# Patient Record
Sex: Female | Born: 1945 | Race: White | Hispanic: No | Smoking: Former smoker
Health system: Southern US, Community
[De-identification: ages and names within clinical notes are randomized; demographics above are authoritative.]

## PROBLEM LIST (undated history)

## (undated) ENCOUNTER — Ambulatory Visit

## (undated) DIAGNOSIS — R9431 Abnormal electrocardiogram [ECG] [EKG]: Secondary | ICD-10-CM

## (undated) DIAGNOSIS — M8580 Other specified disorders of bone density and structure, unspecified site: Secondary | ICD-10-CM

## (undated) DIAGNOSIS — R0683 Snoring: Secondary | ICD-10-CM

## (undated) DIAGNOSIS — D649 Anemia, unspecified: Secondary | ICD-10-CM

## (undated) DIAGNOSIS — F329 Major depressive disorder, single episode, unspecified: Secondary | ICD-10-CM

## (undated) DIAGNOSIS — E785 Hyperlipidemia, unspecified: Secondary | ICD-10-CM

## (undated) DIAGNOSIS — F419 Anxiety disorder, unspecified: Secondary | ICD-10-CM

## (undated) DIAGNOSIS — E039 Hypothyroidism, unspecified: Secondary | ICD-10-CM

## (undated) DIAGNOSIS — G4700 Insomnia, unspecified: Principal | ICD-10-CM

## (undated) DIAGNOSIS — R011 Cardiac murmur, unspecified: Secondary | ICD-10-CM

## (undated) DIAGNOSIS — F32A Depression, unspecified: Secondary | ICD-10-CM

## (undated) DIAGNOSIS — M1990 Unspecified osteoarthritis, unspecified site: Secondary | ICD-10-CM

## (undated) HISTORY — DX: Snoring: R06.83

## (undated) HISTORY — DX: Other specified disorders of bone density and structure, unspecified site: M85.80

## (undated) HISTORY — DX: Abnormal electrocardiogram (ECG) (EKG): R94.31

## (undated) HISTORY — PX: TONSILLECTOMY: SUR1361

## (undated) HISTORY — DX: Insomnia, unspecified: G47.00

## (undated) HISTORY — DX: Anxiety disorder, unspecified: F41.9

## (undated) HISTORY — DX: Major depressive disorder, single episode, unspecified: F32.9

## (undated) HISTORY — DX: Depression, unspecified: F32.A

## (undated) HISTORY — DX: Anemia, unspecified: D64.9

## (undated) HISTORY — DX: Hyperlipidemia, unspecified: E78.5

---

## 1990-11-06 HISTORY — PX: OTHER SURGICAL HISTORY: SHX169

## 1997-12-09 ENCOUNTER — Other Ambulatory Visit: Admission: RE | Admit: 1997-12-09 | Discharge: 1997-12-09 | Payer: Self-pay | Admitting: *Deleted

## 1998-05-24 ENCOUNTER — Other Ambulatory Visit: Admission: RE | Admit: 1998-05-24 | Discharge: 1998-05-24 | Payer: Self-pay | Admitting: Dermatology

## 1998-06-29 ENCOUNTER — Other Ambulatory Visit: Admission: RE | Admit: 1998-06-29 | Discharge: 1998-06-29 | Payer: Self-pay | Admitting: *Deleted

## 1999-07-05 ENCOUNTER — Other Ambulatory Visit: Admission: RE | Admit: 1999-07-05 | Discharge: 1999-07-05 | Payer: Self-pay | Admitting: *Deleted

## 2000-03-06 HISTORY — PX: OTHER SURGICAL HISTORY: SHX169

## 2000-03-14 ENCOUNTER — Ambulatory Visit (HOSPITAL_BASED_OUTPATIENT_CLINIC_OR_DEPARTMENT_OTHER): Admission: RE | Admit: 2000-03-14 | Discharge: 2000-03-14 | Payer: Self-pay | Admitting: Orthopedic Surgery

## 2000-09-06 ENCOUNTER — Other Ambulatory Visit: Admission: RE | Admit: 2000-09-06 | Discharge: 2000-09-06 | Payer: Self-pay | Admitting: *Deleted

## 2000-12-19 ENCOUNTER — Emergency Department (HOSPITAL_COMMUNITY): Admission: EM | Admit: 2000-12-19 | Discharge: 2000-12-19 | Payer: Self-pay | Admitting: Emergency Medicine

## 2001-01-16 ENCOUNTER — Ambulatory Visit (HOSPITAL_COMMUNITY): Admission: RE | Admit: 2001-01-16 | Discharge: 2001-01-16 | Payer: Self-pay | Admitting: Orthopedic Surgery

## 2001-01-16 ENCOUNTER — Encounter: Payer: Self-pay | Admitting: Orthopedic Surgery

## 2001-01-18 ENCOUNTER — Other Ambulatory Visit: Admission: RE | Admit: 2001-01-18 | Discharge: 2001-01-18 | Payer: Self-pay | Admitting: *Deleted

## 2001-08-13 ENCOUNTER — Ambulatory Visit (HOSPITAL_COMMUNITY): Admission: RE | Admit: 2001-08-13 | Discharge: 2001-08-13 | Payer: Self-pay | Admitting: Gastroenterology

## 2001-09-04 ENCOUNTER — Ambulatory Visit (HOSPITAL_BASED_OUTPATIENT_CLINIC_OR_DEPARTMENT_OTHER): Admission: RE | Admit: 2001-09-04 | Discharge: 2001-09-04 | Payer: Self-pay | Admitting: Orthopedic Surgery

## 2001-09-18 ENCOUNTER — Other Ambulatory Visit: Admission: RE | Admit: 2001-09-18 | Discharge: 2001-09-18 | Payer: Self-pay | Admitting: *Deleted

## 2002-02-05 ENCOUNTER — Other Ambulatory Visit: Admission: RE | Admit: 2002-02-05 | Discharge: 2002-02-05 | Payer: Self-pay | Admitting: *Deleted

## 2002-03-05 ENCOUNTER — Ambulatory Visit (HOSPITAL_COMMUNITY): Admission: RE | Admit: 2002-03-05 | Discharge: 2002-03-05 | Payer: Self-pay | Admitting: Internal Medicine

## 2006-08-27 ENCOUNTER — Other Ambulatory Visit: Admission: RE | Admit: 2006-08-27 | Discharge: 2006-08-27 | Payer: Self-pay | Admitting: *Deleted

## 2007-10-07 HISTORY — PX: COLONOSCOPY: SHX174

## 2012-08-07 ENCOUNTER — Other Ambulatory Visit (HOSPITAL_COMMUNITY): Payer: Self-pay | Admitting: Internal Medicine

## 2012-08-07 DIAGNOSIS — J45909 Unspecified asthma, uncomplicated: Secondary | ICD-10-CM

## 2012-08-29 ENCOUNTER — Ambulatory Visit (HOSPITAL_COMMUNITY)
Admission: RE | Admit: 2012-08-29 | Discharge: 2012-08-29 | Disposition: A | Discharge status: Home / Self Care | Payer: Medicare Other | Source: Ambulatory Visit | Attending: Internal Medicine | Admitting: Internal Medicine

## 2012-08-29 DIAGNOSIS — J45909 Unspecified asthma, uncomplicated: Secondary | ICD-10-CM | POA: Insufficient documentation

## 2012-08-29 MED ORDER — ALBUTEROL SULFATE (5 MG/ML) 0.5% IN NEBU
2.5000 mg | INHALATION_SOLUTION | Freq: Once | RESPIRATORY_TRACT | Status: AC
  Administered 2012-08-29: 2.5 mg via RESPIRATORY_TRACT

## 2014-04-27 ENCOUNTER — Ambulatory Visit (INDEPENDENT_AMBULATORY_CARE_PROVIDER_SITE_OTHER): Payer: Medicare Other | Admitting: Neurology

## 2014-04-27 ENCOUNTER — Encounter: Payer: Self-pay | Admitting: Neurology

## 2014-04-27 VITALS — BP 108/72 | HR 64 | Resp 18 | Ht 64.75 in | Wt 136.0 lb

## 2014-04-27 DIAGNOSIS — G4700 Insomnia, unspecified: Secondary | ICD-10-CM

## 2014-04-27 DIAGNOSIS — R0683 Snoring: Secondary | ICD-10-CM

## 2014-04-27 DIAGNOSIS — F32A Depression, unspecified: Secondary | ICD-10-CM

## 2014-04-27 DIAGNOSIS — R0989 Other specified symptoms and signs involving the circulatory and respiratory systems: Secondary | ICD-10-CM

## 2014-04-27 DIAGNOSIS — F329 Major depressive disorder, single episode, unspecified: Secondary | ICD-10-CM

## 2014-04-27 DIAGNOSIS — R0609 Other forms of dyspnea: Secondary | ICD-10-CM

## 2014-04-27 DIAGNOSIS — F3289 Other specified depressive episodes: Secondary | ICD-10-CM

## 2014-04-27 HISTORY — DX: Snoring: R06.83

## 2014-04-27 HISTORY — DX: Insomnia, unspecified: G47.00

## 2014-04-27 NOTE — Progress Notes (Signed)
[facility] SLE[facility]   [name]:  [name], M D  Referring Symone Cornman: Jarome Matin, MD Primary Care Physician:  Garlan Fillers, MD  Chief Complaint  Patient presents with  . New Evaluation    Room 10  . Sleep consult    HPI:  Kaylee Hull is a 68 y.o. caucasian, right handed, divorced female, who is seen here as a referral from Dr. Eloise Harman for an insomnia work up.  Reports that she has frequent trouble to initiate sleep feeling very fatigued and desires to go to sleep. She also stated that the symptoms are reminiscent of previous period of insomnia that occurred while she suffered from anxiety and depression. The suspicion is that the current insomnia is also related to a similar underlying problem with depression. She had several periods of monopolar depression over the last 2 30 years .  She describes a sense of Doom, a fear of something bad is going to happen to her, and she wakes up frequently throughout the night . She wakes up early , and she sleeps not longer than 2 hours "en bloc" .  She goes to the bathroom because she is awake, not because the urinary urgency woke her. Her feelings of helplessness and loss of control are independent of her knowledge of the difference- she is healthy and has no specific financial or other worries. . She was until 12 month ago on Zoloft, but not any longer. Hot flushes started after she d/c the Zoloft. She was already "done with monopause" more than 10 years ago. She feels frustrated about the poor sleep quality, in spite of dietary efforts, exercises and medical attention.  The insomnia started 4 month ago again, and she has been told she snores, but  never has been told that she has apnea. She lives alone, her grandchildren reported the snoring. She feels that snoring has woken herself rarely, never had she choked or struggled for breathing.  She rarely takes Ambien, about ' twice a year". 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  She states she never had asthma , but carried the diagnosis in old medical charts.   Sleep habits : the patient goes to bed around 11.30 to midnight , some nights going right to sleep, other nights being awake for an hour. She doesn't need an alarm , wakes spontaneously around 7AM.  Unrefreshed, struggling to get up,  but feels her depression lifting a little after she gets up . Drinks no longer coffee in AM, only water . Her appetite is unchanged. Not easily nauseated. She sometimes naps , she fell asleep in bible study recently. Her naps have been reduced form 1 hour to about 20 minutes  She never feels asleep- there could be a sleep perception, as she doubts she slept at all but hears from her grandchildren a report that she slept.      Review of Systems: Out of a complete 14 system review, the patient complains of only the following symptoms, and all other reviewed systems are negative. Snoring, anxiety, a worries.   History   Social History  . Marital Status: Divorced    Spouse Name: N/A    Number of Children: 2  . Years of Education: College   Occupational History  . Not on file.   Social History Main Topics  .  Smoking status: Former Smoker    Quit date: 05/13/1991  . Smokeless tobacco: Never Used  . Alcohol Use: Yes     Comment: rarely  . Drug Use: No  . Sexual Activity: Not on file   Other Topics Concern  . Not on file   Social History Narrative   Patient is divorced and lives alone.   Patient has two adult children.   Patient is retired.   Patient has a Chiropodistcollege eduction.   Patient is right-handed.   Patient does not drink any caffeine.    Family History  Problem Relation Age of Onset  . Diabetes Mother   . Liver cancer Father   . Coronary artery disease Mother   . Lung cancer Mother     Past  Medical History  Diagnosis Date  . Fibromyalgia   . Migraine headache   . Hyperlipidemia   . Osteopenia   . Depression   . Anxiety   . Anemia   . Abnormal EKG   . Insomnia 04/27/2014    Past Surgical History  Procedure Laterality Date  . Lap pelvis/d & c  1990's  . Laser throat  1992  . 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 Uterine emd/d & c, hysteroscopy  03/02  . Colonoscopy  12/08    several polyps removed    Current Outpatient Prescriptions  Medication Sig Dispense Refill  . alendronate (FOSAMAX) 70 MG tablet 1 tablet once a week.      . levothyroxine (SYNTHROID, LEVOTHROID) 50 MCG tablet 1 tablet daily.      . polyethylene glycol powder (GLYCOLAX/MIRALAX) powder 1 Capful in water or juice, stir and drink as needed      . simvastatin (ZOCOR) 40 MG tablet 1 tablet daily.      Marland Kitchen. tretinoin (RETIN-A) 0.05 % cream As needed      . Vitamin D, Ergocalciferol, (DRISDOL) 50000 UNITS CAPS capsule Take 50,000 Units by mouth every 7 (seven) days.      Marland Kitchen. zolpidem (AMBIEN) 10 MG tablet 10 mg. Taking 1/2- 1 tablet by mouth at bedtime as needed for sleep       No current facility-administered medications for this visit.    Allergies as of 04/27/2014 - Review Complete 04/27/2014  Allergen Reaction Noted  . Other  04/27/2014  . Sulfa antibiotics  04/27/2014    Vitals: BP 108/72  Pulse 64  Resp 18  Ht 5' 4.75" (1.645 m)  Wt 136 lb (61.689 kg)  BMI 22.80 kg/m2 Last Weight:  Wt Readings from Last 1 Encounters:  04/27/14 136 lb (61.689 kg)   Last Height:   Ht Readings from Last 1 Encounters:  04/27/14 5' 4.75" (1.645 m)    Physical exam:  General: The patient is awake, alert and appears not in acute distress. The patient is well groomed. Head: Normocephalic, atraumatic. Neck is supple. Mallampati 3 - low uvular - no retrognathia, , neck circumference: 14 inches, no TMJ but click over the left jaw. .  Cardiovascular:  Regular rate and rhythm , without  murmurs or carotid bruit, and  without distended neck veins. No retrognathia, normal dentition. Respiratory: Lungs are clear to auscultation. Skin:  Without evidence of edema, or rash Trunk: BMI is elevated and patient  has normal posture.  Neurologic exam : The patient is awake and alert, oriented to place and time.  Memory subjective  described as intact.  There is a normal attention span & concentration ability. Speech is fluent without dysarthria, dysphonia or  aphasia. Mood and affect are soft spoken and concerned . Cranial nerves: Pupils are equal and briskly reactive to light. Funduscopic exam without  evidence of pallor or edema.  Extraocular movements  in vertical and horizontal planes intact and without nystagmus. Visual fields by finger perimetry are intact. Hearing to finger rub intact.  Facial sensation intact to fine touch. Facial motor strength is symmetric and tongue and uvula move midline.  Motor exam:   Normal tone and normal muscle bulk and symmetric normal strength in all extremities.  Sensory:  Fine touch, pinprick and vibration were tested in all extremities. Proprioception is  normal.  Coordination: Rapid alternating movements in the fingers/hands is tested and normal. Finger-to-nose maneuver tested and normal without evidence of ataxia, dysmetria or tremor.  Gait and station: Patient walks without assistive device . Deep tendon reflexes: in the  upper and lower extremities are symmetric and intact. Babinski maneuver response is  downgoing.   Assessment:  After physical and neurologic examination, review of laboratory studies, imaging, neurophysiology testing and pre-existing records, assessment is   1) this insomnia resembles previous phases the patient experienced at various times in her life.  There is most likely depression and anxiety causing the early morning awakening , sleep sustaining insomnia.   Plan:  Treatment plan and additional workup : 1) I will be working her up for snoring and  possible apnea as organic causes contributing to Insomnia.  If these are negative ,she will ned to follow up with psychologist or psychiatrist to address the depression.

## 2014-04-27 NOTE — Patient Instructions (Signed)
Home sleep test. Insomnia Insomnia is frequent trouble falling and/or staying asleep. Insomnia can be a long term problem or a short term problem. Both are common. Insomnia can be a short term problem when the wakefulness is related to a certain stress or worry. Long term insomnia is often related to ongoing stress during waking hours and/or poor sleeping habits. Overtime, sleep deprivation itself can make the problem worse. Every little thing feels more severe because you are overtired and your ability to cope is decreased. CAUSES   Stress, anxiety, and depression.  Poor sleeping habits.  Distractions such as TV in the bedroom.  Naps close to bedtime.  Engaging in emotionally charged conversations before bed.  Technical reading before sleep.  Alcohol and other sedatives. They may make the problem worse. They can hurt normal sleep patterns and normal dream activity.  Stimulants such as caffeine for several hours prior to bedtime.  Pain syndromes and shortness of breath can cause insomnia.  Exercise late at night.  Changing time zones may cause sleeping problems (jet lag). It is sometimes helpful to have someone observe your sleeping patterns. They should look for periods of not breathing during the night (sleep apnea). They should also look to see how long those periods last. If you live alone or observers are uncertain, you can also be observed at a sleep clinic where your sleep patterns will be professionally monitored. Sleep apnea requires a checkup and treatment. Give your caregivers your medical history. Give your caregivers observations your family has made about your sleep.  SYMPTOMS   Not feeling rested in the morning.  Anxiety and restlessness at bedtime.  Difficulty falling and staying asleep. TREATMENT   Your caregiver may prescribe treatment for an underlying medical disorders. Your caregiver can give advice or help if you are using alcohol or other drugs for  self-medication. Treatment of underlying problems will usually eliminate insomnia problems.  Medications can be prescribed for short time use. They are generally not recommended for lengthy use.  Over-the-counter sleep medicines are not recommended for lengthy use. They can be habit forming.  You can promote easier sleeping by making lifestyle changes such as:  Using relaxation techniques that help with breathing and reduce muscle tension.  Exercising earlier in the day.  Changing your diet and the time of your last meal. No night time snacks.  Establish a regular time to go to bed.  Counseling can help with stressful problems and worry.  Soothing music and white noise may be helpful if there are background noises you cannot remove.  Stop tedious detailed work at least one hour before bedtime. HOME CARE INSTRUCTIONS   Keep a diary. Inform your caregiver about your progress. This includes any medication side effects. See your caregiver regularly. Take note of:  Times when you are asleep.  Times when you are awake during the night.  The quality of your sleep.  How you feel the next day. This information will help your caregiver care for you.  Get out of bed if you are still awake after 15 minutes. Read or do some quiet activity. Keep the lights down. Wait until you feel sleepy and go back to bed.  Keep regular sleeping and waking hours. Avoid naps.  Exercise regularly.  Avoid distractions at bedtime. Distractions include watching television or engaging in any intense or detailed activity like attempting to balance the household checkbook.  Develop a bedtime ritual. Keep a familiar routine of bathing, brushing your teeth, climbing into bed  at the same time each night, listening to soothing music. Routines increase the success of falling to sleep faster.  Use relaxation techniques. This can be using breathing and muscle tension release routines. It can also include visualizing  peaceful scenes. You can also help control troubling or intruding thoughts by keeping your mind occupied with boring or repetitive thoughts like the old concept of counting sheep. You can make it more creative like imagining planting one beautiful flower after another in your backyard garden.  During your day, work to eliminate stress. When this is not possible use some of the previous suggestions to help reduce the anxiety that accompanies stressful situations. MAKE SURE YOU:   Understand these instructions.  Will watch your condition.  Will get help right away if you are not doing well or get worse. Document Released: 10/20/2000 Document Revised: 01/15/2012 Document Reviewed: 11/20/2007 [facility] Patient Information 2015 [place], [place]. This information is not intended to replace advice given to you by your health care provider. Make sure you discuss any questions you have with your health care provider.  If no apnea and hypoxemia is found, you will need to address the depression as likely underlying cause of insomnia.

## 2014-04-28 ENCOUNTER — Encounter: Payer: Self-pay | Admitting: *Deleted

## 2014-04-28 ENCOUNTER — Ambulatory Visit (INDEPENDENT_AMBULATORY_CARE_PROVIDER_SITE_OTHER): Payer: Medicare Other | Admitting: Neurology

## 2014-04-28 DIAGNOSIS — G4700 Insomnia, unspecified: Secondary | ICD-10-CM

## 2014-04-28 DIAGNOSIS — R0683 Snoring: Secondary | ICD-10-CM

## 2014-04-28 DIAGNOSIS — F329 Major depressive disorder, single episode, unspecified: Secondary | ICD-10-CM

## 2014-04-28 DIAGNOSIS — R0989 Other specified symptoms and signs involving the circulatory and respiratory systems: Secondary | ICD-10-CM

## 2014-04-28 DIAGNOSIS — R0609 Other forms of dyspnea: Secondary | ICD-10-CM

## 2014-04-28 DIAGNOSIS — F32A Depression, unspecified: Secondary | ICD-10-CM

## 2014-04-28 NOTE — Sleep Study (Signed)
Patient arrives for HST instruction.  Patient is given written instructions, picture instructions, and a demonstration on how to use HST unit.  All questions / concerns were addressed by technologist.  Financial responsibility was explained.  Follow up information was given to patient regarding how results would be received.  

## 2014-04-29 ENCOUNTER — Telehealth: Payer: Self-pay | Admitting: Diagnostic Neuroimaging

## 2014-04-29 NOTE — Telephone Encounter (Signed)
Please call pt about machine. Not working. -VRP

## 2014-04-30 ENCOUNTER — Encounter: Payer: Medicare Other | Admitting: *Deleted

## 2014-04-30 NOTE — Sleep Study (Signed)
Pt arrives for reinstruction, this time I have her put the unit on how she had it last night.  She did well.  She is still quite frustrated with the issue of not getting her questions answered by phone.  I had her lie on the bed and we watched the unit.  The lights to flicker from green to red for brief periods when she moves or her breathing changes (such as during talking) but they always turn green again.  I didn't realize the unit was this sensitive myself so I am glad she came in to show me.  However, I do feel that as long as the green lights come back, that the data should be okay.  We agree to try one more night as she didn't sleep real well the first two.  She is given new batteries.  I let her know that when she returns it I can review data from all three nights.  She will return the unit tomorrow.

## 2014-04-30 NOTE — Telephone Encounter (Signed)
Spoke to patient regarding her home sleep test.  She has had trouble for the past two nights.  She was quite upset because she has been calling the answering service as instructed and no one has been calling her back.  She said she called four separate occasions.  I assured her that she would not be billed for any of these nights and she has agreed to meet with me today for reinstruction and then she will try it again tonight.  To follow up:  I never received any of these after hours calls and that is how things are set up with the answering service.  I called this morning and spoke with a supervisor who took additional key words and will forward anything sleep related or sleep test related to me once again.  She is forwarding the information to her operators.  She said the confusion may have been in that the patient called and said her "apnea machine was not working", and didn't mention anything about a sleep test.  Patient coming in at 2 PM today and rather than demonstrating use, we will put the unit on her and make adjustments so she can use it effectively.  After this third attempt, we will have to pursue in lab testing authorization to get information. 

## 2014-04-30 NOTE — Sleep Study (Signed)
Spoke to patient regarding her home sleep test.  She has had trouble for the past two nights.  She was quite upset because she has been calling the answering service as instructed and no one has been calling her back.  She said she called four separate occasions.  I assured her that she would not be billed for any of these nights and she has agreed to meet with me today for reinstruction and then she will try it again tonight.  To follow up:  I never received any of these after hours calls and that is how things are set up with the answering service.  I called this morning and spoke with a supervisor who took additional key words and will forward anything sleep related or sleep test related to me once again.  She is forwarding the information to her operators.  She said the confusion may have been in that the patient called and said her "apnea machine was not working", and didn't mention anything about a sleep test.  Patient coming in at 2 PM today and rather than demonstrating use, we will put the unit on her and make adjustments so she can use it effectively.  After this third attempt, we will have to pursue in lab testing authorization to get information.

## 2014-05-07 ENCOUNTER — Encounter: Payer: Self-pay | Admitting: *Deleted

## 2014-05-07 ENCOUNTER — Telehealth: Payer: Self-pay | Admitting: Neurology

## 2014-05-07 NOTE — Telephone Encounter (Signed)
I called and left a message for the patient about her recent home sleep test. I informed the patient that the study did not show any evidence of apnea and that Dr. Vickey Hugerohmeier recommend treating depression to address insomnia with her PCP. I will fax a copy of the report to Dr. Georgiann Hahnaniel Paterson's office and mail a copy to the patient.

## 2016-08-30 ENCOUNTER — Ambulatory Visit
Admission: RE | Admit: 2016-08-30 | Discharge: 2016-08-30 | Disposition: A | Discharge status: Home / Self Care | Payer: Medicare Other | Source: Ambulatory Visit | Attending: Internal Medicine | Admitting: Internal Medicine

## 2016-08-30 ENCOUNTER — Other Ambulatory Visit: Payer: Self-pay | Admitting: Internal Medicine

## 2016-08-30 DIAGNOSIS — M7918 Myalgia, other site: Secondary | ICD-10-CM

## 2016-09-15 ENCOUNTER — Other Ambulatory Visit: Payer: Self-pay | Admitting: Internal Medicine

## 2016-09-15 DIAGNOSIS — M545 Low back pain, unspecified: Secondary | ICD-10-CM

## 2016-09-15 DIAGNOSIS — G8929 Other chronic pain: Secondary | ICD-10-CM

## 2016-09-27 ENCOUNTER — Ambulatory Visit
Admission: RE | Admit: 2016-09-27 | Discharge: 2016-09-27 | Disposition: A | Discharge status: Home / Self Care | Payer: Medicare Other | Source: Ambulatory Visit | Attending: Internal Medicine | Admitting: Internal Medicine

## 2016-09-27 ENCOUNTER — Other Ambulatory Visit: Payer: Self-pay | Admitting: Internal Medicine

## 2016-09-27 DIAGNOSIS — M545 Low back pain, unspecified: Secondary | ICD-10-CM

## 2016-09-27 DIAGNOSIS — G8929 Other chronic pain: Secondary | ICD-10-CM

## 2016-10-11 ENCOUNTER — Ambulatory Visit (INDEPENDENT_AMBULATORY_CARE_PROVIDER_SITE_OTHER): Payer: Medicare Other | Admitting: Physical Medicine and Rehabilitation

## 2016-10-11 ENCOUNTER — Encounter (INDEPENDENT_AMBULATORY_CARE_PROVIDER_SITE_OTHER): Payer: Self-pay | Admitting: Physical Medicine and Rehabilitation

## 2016-10-11 ENCOUNTER — Encounter (INDEPENDENT_AMBULATORY_CARE_PROVIDER_SITE_OTHER): Payer: Self-pay

## 2016-10-11 VITALS — BP 119/71 | HR 64

## 2016-10-11 DIAGNOSIS — M48062 Spinal stenosis, lumbar region with neurogenic claudication: Secondary | ICD-10-CM | POA: Diagnosis not present

## 2016-10-11 DIAGNOSIS — M25551 Pain in right hip: Secondary | ICD-10-CM

## 2016-10-11 DIAGNOSIS — M1611 Unilateral primary osteoarthritis, right hip: Secondary | ICD-10-CM

## 2016-10-11 DIAGNOSIS — M5416 Radiculopathy, lumbar region: Secondary | ICD-10-CM | POA: Diagnosis not present

## 2016-10-11 NOTE — Progress Notes (Signed)
Kaylee Hull - 70 y.o. female MRN [id number]  Date of birth: [birth year]  Office Visit Note: Visit Date: 10/11/2016 PCP: Garlan Fillers, MD Referred by: Jarome Matin, MD  Subjective: Chief Complaint  Patient presents with  . Lower Back - Pain   HPI: Mrs. Lievanos is a 70 year old female that I saw last 2015 at the request of Dr. Eloise Harman. At the time she did not have an MRI of her lumbar spine was having a lot of back pain. We did look at x-rays and discussed her case with her and determined that she was having probably pain from arthritis at that point. She really does not like the idea of any sort of interventions or injections and at that time really held off from anything like that. She continues to treat her back conservatively with medication management and activity modification etc. Since I've seen her she's been followed by Dr. Lequita Halt for her right hip. She reports a lot of hip pain and groin pain pain with motion. She is generally extend or flex the hip. She has a hard time getting out of a car and up and down stairs. She is scheduled to have a hip replacement done in January. She has not had any injections diagnostically in the hip. She has been seeing Dr. Eloise Harman who did order an MRI of her lumbar spine and this is reviewed below. There is some question about a pain that she is having that starts really posterior and goes posterior laterally to the knee. No real numbness or tingling but it is a burning quality. No left-sided symptoms. Nothing past the knee. No numbness tingling paresthesias in the feet. No focal weakness. She actually states that she's not really having a lot of low back pain at all at this point. The symptoms going down to the knee are worse with sitting she actually can stand and walk fairly well without it bothering her.    Review of Systems  Constitutional: Negative for chills, fever, malaise/fatigue and weight loss.  HENT: Negative for hearing loss and  sinus pain.   Eyes: Negative for blurred vision, double vision and photophobia.  Respiratory: Negative for cough and shortness of breath.   Cardiovascular: Negative for chest pain, palpitations and leg swelling.  Gastrointestinal: Negative for abdominal pain, nausea and vomiting.  Genitourinary: Negative for flank pain.  Musculoskeletal: Positive for joint pain. Negative for myalgias.  Skin: Negative for itching and rash.  Neurological: Negative for tremors, focal weakness and weakness.  Endo/Heme/Allergies: Negative.   Psychiatric/Behavioral: Negative for depression.  All other systems reviewed and are negative.  Otherwise per HPI.  Assessment & Plan: Visit Diagnoses:  1. Lumbar radiculopathy   2. Spinal stenosis of lumbar region with neurogenic claudication   3. Pain in right hip   4. Unilateral primary osteoarthritis, right hip     Plan: Findings:  Chronic history of low back pain and actually is doing somewhat better at this point because mainly her right hip is giving her some much problem. Exam is consistent with hip pathology with pain on rotation. I do think is the biggest source of her pain. She is can have this operated on and replaced by Dr. Lequita Halt in January. She has this pain posteriorly down to the knee which could be radicular. New MRI of the lumbar spine does show moderate stenosis at L3-4 L4-5. No focal compression. We discussed at length the merits of injections diagnostically and what it would tell us. We also spoke at  length about doing the shots fairly comfortably. We can also use something like preprocedure Valium or Ambien as a relaxer. The hip injection would be the choice I think in this case to prove how much of the pain really is coming from her hip. There is a good chance of the posterior lateral pain is still hip related. The sciatic nerve does innervate the posterior capsule and we did get some referral patterns that direction. If you did a hip injection and all  of her pain was better during the anesthetic phase benefit think you have an answer. Conversely you could look at a lumbar spine injection to see if that pain did get resolved with the injection but I don't think this could be as diagnostic. The other option obviously is to go along with the hip replacement see what sort of relief he didn't always skin cervical back with the issue of the lumbar spine. Her stenosis is not severe and on exam she doesn't really have any neurologic findings. She is going to think about the injection and will get back to us as far as what she would like to do. I spent more than 25 minutes speaking face-to-face with the patient with 50% of the time in counseling.    Meds & Orders: No orders of the defined types were placed in this encounter.  No orders of the defined types were placed in this encounter.   Follow-up: Return If patient decides to complete diagnostic hip or back injection..   Procedures: No procedures performed  No notes on file   Clinical History: Lumbar spine MRI 09/27/2016 IMPRESSION: Advanced disc degeneration and facet arthropathy at L3-4 and L4-5 with moderate spinal stenosis. Facet arthropathy is greater at L3-4 where there is grade 1 anterolisthesis. No foraminal impingement.    She reports that she quit smoking about 25 years ago. She has never used smokeless tobacco. No results for input(s): HGBA1C, LABURIC in the last 8760 hours.  Objective:  VS:  HT:    WT:   BMI:     BP:119/71  HR:64bpm  TEMP: ( )  RESP:  Physical Exam  Constitutional: She is oriented to person, place, and time. She appears well-developed and well-nourished.  Eyes: Conjunctivae and EOM are normal. Pupils are equal, round, and reactive to light.  Cardiovascular: Normal rate and intact distal pulses.   Pulmonary/Chest: Effort normal.  Musculoskeletal:  She ambulates without aid with an antalgic gait to the right with some Trendelenburg type gait. She has painful  range of motion of the right hip with groin pain with internal rotation and more posterior pain with external rotation. She has good distal strength bilaterally. She has no clonus bilaterally. She does have some pain with extension rotation of the lumbar spine.  Neurological: She is alert and oriented to person, place, and time.  Skin: Skin is warm and dry. No rash noted. No erythema.  Psychiatric: She has a normal mood and affect. Her behavior is normal.  Nursing note and vitals reviewed.   Ortho Exam Imaging: No results found.  Past Medical/Family/Surgical/Social History: Medications & Allergies reviewed per EMR Patient Active Problem List   Diagnosis Date Noted  . Insomnia 04/27/2014  . Snoring 04/27/2014   Past Medical History:  Diagnosis Date  . Abnormal EKG   . Anemia   . Anxiety   . Depression   . Fibromyalgia   . Hyperlipidemia   . Insomnia 04/27/2014  . Migraine headache   . Osteopenia   .  Snoring 04/27/2014   Family History  Problem Relation Age of Onset  . Liver cancer Father   . Diabetes Mother   . Coronary artery disease Mother   . Lung cancer Mother    Past Surgical History:  Procedure Laterality Date  . COLONOSCOPY  12/08   several polyps removed  . lap pelvis/D & C  1990's  . laser throat  1992  . ulnar neuropathy Left 05/01  . uterine EMD/D & C, Hysteroscopy  03/02   Social History   Occupational History  . Not on file.   Social History Main Topics  . Smoking status: Former Smoker    Quit date: 05/13/1991  . Smokeless tobacco: Never Used  . Alcohol use Yes     Comment: rarely  . Drug use: No  . Sexual activity: Not on file

## 2016-10-17 NOTE — Progress Notes (Signed)
Pt is being scheduled for preop appt; please place surgical orders in epic. Thanks.  

## 2016-11-03 NOTE — Progress Notes (Signed)
Please place SURGICAL ORDERS IN EPIC--has pre op 11/09/16  thanks 

## 2016-11-07 ENCOUNTER — Ambulatory Visit: Payer: Self-pay | Admitting: Orthopedic Surgery

## 2016-11-08 ENCOUNTER — Other Ambulatory Visit (HOSPITAL_COMMUNITY): Payer: Self-pay | Admitting: *Deleted

## 2016-11-08 NOTE — Progress Notes (Signed)
08/30/2016 medical clearance Dr Jarome Matinaniel Paterson GMA 10/11/2016 LOV Dr Tyrell AntonioFrederic Newton

## 2016-11-08 NOTE — Patient Instructions (Addendum)
Kaylee Hull  11/08/2016   Your procedure is scheduled on: 11/15/2016  [facility]  Entrance take [place]  elevators to 3rd floor to  Short Stay Center at [phone].  Call this number if you have problems the morning of surgery [phone]   Remember: ONLY 1 PERSON MAY GO WITH YOU TO SHORT STAY TO GET  READY MORNING OF YOUR SURGERY.  Do not eat food or drink liquids :After Midnight.     Take these medicines the morning of surgery with A SIP OF WATER: , Synthroid, sertraline(Zoloft)                                You may not have any metal on your body including hair pins and              piercings  Do not wear jewelry, make-up, lotions, powders or perfumes, deodorant             Do not wear nail polish.  Do not shave  48 hours prior to surgery.              Men may shave face and neck.   Do not bring valuables to the hospital. [name] IS NOT             RESPONSIBLE   FOR VALUABLES.  Contacts, dentures or bridgework may not be worn into surgery.  Leave suitcase in the car. After surgery it may be brought to your room.               Please read over the following fact sheets you were given: _____________________________________________________________________             [facility] Pre[facility] Before surgery, you can play an important role.  Because skin is not sterile, your skin needs to be as free of germs as possible.  You can reduce the number of germs on your skin by washing with CHG (chlorahexidine gluconate) soap before surgery.  CHG is an antiseptic cleaner which kills germs and bonds with the skin to continue killing germs even after washing. Please DO NOT use if you have an allergy to CHG or antibacterial soaps.  If your skin becomes reddened/irritated stop using the CHG and inform your nurse when you arrive at Short Stay. Do not shave (including legs and underarms) for at least 48 hours prior to the first CHG shower.   You may shave your face/neck. Please follow these instructions carefully:  1.  Shower with CHG Soap the night before surgery and the  morning of Surgery.  2.  If you choose to wash your hair, wash your hair first as usual with your  normal  shampoo.  3.  After you shampoo, rinse your hair and body thoroughly to remove the  shampoo.                           4.  Use CHG as you would any other liquid soap.  You can apply chg directly  to the skin and wash                       Gently with a scrungie or clean washcloth.  5.  Apply the CHG Soap to your  body ONLY FROM THE NECK DOWN.   Do not use on face/ open                           Wound or open sores. Avoid contact with eyes, ears mouth and genitals (private parts).                       Wash face,  Genitals (private parts) with your normal soap.             6.  Wash thoroughly, paying special attention to the area where your surgery  will be performed.  7.  Thoroughly rinse your body with warm water from the neck down.  8.  DO NOT shower/wash with your normal soap after using and rinsing off  the CHG Soap.                9.  Pat yourself dry with a clean towel.            10.  Wear clean pajamas.            11.  Place clean sheets on your bed the night of your first shower and do not  sleep with pets. Day of Surgery : Do not apply any lotions/deodorants the morning of surgery.  Please wear clean clothes to the hospital/surgery center.  FAILURE TO FOLLOW THESE INSTRUCTIONS MAY RESULT IN THE CANCELLATION OF YOUR SURGERY PATIENT SIGNATURE_________________________________  NURSE SIGNATURE__________________________________  ________________________________________________________________________   [name]  An incentive spirometer is a tool that can help keep your lungs clear and active. This tool measures how well you are filling your lungs with each breath. Taking long deep breaths may help reverse or decrease the chance of  developing breathing (pulmonary) problems (especially infection) following:  A long period of time when you are unable to move or be active. BEFORE THE PROCEDURE   If the spirometer includes an indicator to show your best effort, your nurse or respiratory therapist will set it to a desired goal.  If possible, sit up straight or lean slightly forward. Try not to slouch.  Hold the incentive spirometer in an upright position. INSTRUCTIONS FOR USE  1. Sit on the edge of your bed if possible, or sit up as far as you can in bed or on a chair. 2. Hold the incentive spirometer in an upright position. 3. Breathe out normally. 4. Place the mouthpiece in your mouth and seal your lips tightly around it. 5. Breathe in slowly and as deeply as possible, raising the piston or the ball toward the top of the column. 6. Hold your breath for 3-5 seconds or for as long as possible. Allow the piston or ball to fall to the bottom of the column. 7. Remove the mouthpiece from your mouth and breathe out normally. 8. Rest for a few seconds and repeat Steps 1 through 7 at least 10 times every 1-2 hours when you are awake. Take your time and take a few normal breaths between deep breaths. 9. The spirometer may include an indicator to show your best effort. Use the indicator as a goal to work toward during each repetition. 10. After each set of 10 deep breaths, practice coughing to be sure your lungs are clear. If you have an incision (the cut made at the time of surgery), support your incision when coughing by placing a pillow or rolled up towels firmly against it.  Once you are able to get out of bed, walk around indoors and cough well. You may stop using the incentive spirometer when instructed by your caregiver.  RISKS AND COMPLICATIONS  Take your time so you do not get dizzy or light-headed.  If you are in pain, you may need to take or ask for pain medication before doing incentive spirometry. It is harder to take a  deep breath if you are having pain. AFTER USE  Rest and breathe slowly and easily.  It can be helpful to keep track of a log of your progress. Your caregiver can provide you with a simple table to help with this. If you are using the spirometer at home, follow these instructions: [place] IF:   You are having difficultly using the spirometer.  You have trouble using the spirometer as often as instructed.  Your pain medication is not giving enough relief while using the spirometer.  You develop fever of 100.5 F (38.1 C) or higher. SEEK IMMEDIATE MEDICAL CARE IF:   You cough up bloody sputum that had not been present before.  You develop fever of 102 F (38.9 C) or greater.  You develop worsening pain at or near the incision site. MAKE SURE YOU:   Understand these instructions.  Will watch your condition.  Will get help right away if you are not doing well or get worse. Document Released: 03/05/2007 Document Revised: 01/15/2012 Document Reviewed: 05/06/2007 Exit[employer] Patient Information 2014 [employer], [place].   ________________________________________________________________________  WHAT IS A BLOOD TRANSFUSION? Blood Transfusion Information  A transfusion is the replacement of blood or some of its parts. Blood is made up of multiple cells which provide different functions.  Red blood cells carry oxygen and are used for blood loss replacement.  White blood cells fight against infection.  Platelets control bleeding.  Plasma helps clot blood.  Other blood products are available for specialized needs, such as hemophilia or other clotting disorders. BEFORE THE TRANSFUSION  Who gives blood for transfusions?   Healthy volunteers who are fully evaluated to make sure their blood is safe. This is blood bank blood. Transfusion therapy is the safest it has ever been in the practice of medicine. Before blood is taken from a donor, a complete history is taken to make  sure that person has no history of diseases nor engages in risky social behavior (examples are intravenous drug use or sexual activity with multiple partners). The donor's travel history is screened to minimize risk of transmitting infections, such as malaria. The donated blood is tested for signs of infectious diseases, such as HIV and hepatitis. The blood is then tested to be sure it is compatible with you in order to minimize the chance of a transfusion reaction. If you or a relative donates blood, this is often done in anticipation of surgery and is not appropriate for emergency situations. It takes many days to process the donated blood. RISKS AND COMPLICATIONS Although transfusion therapy is very safe and saves many lives, the main dangers of transfusion include:   Getting an infectious disease.  Developing a transfusion reaction. This is an allergic reaction to something in the blood you were given. Every precaution is taken to prevent this. The decision to have a blood transfusion has been considered carefully by your caregiver before blood is given. Blood is not given unless the benefits outweigh the risks. AFTER THE TRANSFUSION  Right after receiving a blood transfusion, you will usually feel much better and more energetic.  This is especially true if your red blood cells have gotten low (anemic). The transfusion raises the level of the red blood cells which carry oxygen, and this usually causes an energy increase.  The nurse administering the transfusion will monitor you carefully for complications. HOME CARE INSTRUCTIONS  No special instructions are needed after a transfusion. You may find your energy is better. Speak with your caregiver about any limitations on activity for underlying diseases you may have. SEEK MEDICAL CARE IF:   Your condition is not improving after your transfusion.  You develop redness or irritation at the intravenous (IV) site. SEEK IMMEDIATE MEDICAL CARE IF:   Any of the following symptoms occur over the next 12 hours:  Shaking chills.  You have a temperature by mouth above 102 F (38.9 C), not controlled by medicine.  Chest, back, or muscle pain.  People around you feel you are not acting correctly or are confused.  Shortness of breath or difficulty breathing.  Dizziness and fainting.  You get a rash or develop hives.  You have a decrease in urine output.  Your urine turns a dark color or changes to pink, red, or brown. Any of the following symptoms occur over the next 10 days:  You have a temperature by mouth above 102 F (38.9 C), not controlled by medicine.  Shortness of breath.  Weakness after normal activity.  The white part of the eye turns yellow (jaundice).  You have a decrease in the amount of urine or are urinating less often.  Your urine turns a dark color or changes to pink, red, or brown. Document Released: 10/20/2000 Document Revised: 01/15/2012 Document Reviewed: 06/08/2008 [facility] Patient Information 2014 [place], [place].  _______________________________________________________________________

## 2016-11-09 ENCOUNTER — Encounter (HOSPITAL_COMMUNITY): Payer: Self-pay

## 2016-11-09 ENCOUNTER — Encounter (HOSPITAL_COMMUNITY)
Admission: RE | Admit: 2016-11-09 | Discharge: 2016-11-09 | Disposition: A | Discharge status: Home / Self Care | Payer: Medicare Other | Source: Ambulatory Visit | Attending: Orthopedic Surgery | Admitting: Orthopedic Surgery

## 2016-11-09 DIAGNOSIS — Z01818 Encounter for other preprocedural examination: Secondary | ICD-10-CM | POA: Diagnosis not present

## 2016-11-09 DIAGNOSIS — I10 Essential (primary) hypertension: Secondary | ICD-10-CM | POA: Diagnosis not present

## 2016-11-09 DIAGNOSIS — M1611 Unilateral primary osteoarthritis, right hip: Secondary | ICD-10-CM | POA: Insufficient documentation

## 2016-11-09 HISTORY — DX: Hypothyroidism, unspecified: E03.9

## 2016-11-09 HISTORY — DX: Cardiac murmur, unspecified: R01.1

## 2016-11-09 HISTORY — DX: Unspecified osteoarthritis, unspecified site: M19.90

## 2016-11-09 LAB — CBC
HEMATOCRIT: 31.8 % — AB (ref 36.0–46.0)
HEMOGLOBIN: 10.8 g/dL — AB (ref 12.0–15.0)
MCH: 30 pg (ref 26.0–34.0)
MCHC: 34 g/dL (ref 30.0–36.0)
MCV: 88.3 fL (ref 78.0–100.0)
Platelets: 204 10*3/uL (ref 150–400)
RBC: 3.6 MIL/uL — ABNORMAL LOW (ref 3.87–5.11)
RDW: 12.9 % (ref 11.5–15.5)
WBC: 3.2 10*3/uL — ABNORMAL LOW (ref 4.0–10.5)

## 2016-11-09 LAB — COMPREHENSIVE METABOLIC PANEL
ALK PHOS: 46 U/L (ref 38–126)
ALT: 11 U/L — ABNORMAL LOW (ref 14–54)
ANION GAP: 6 (ref 5–15)
AST: 19 U/L (ref 15–41)
Albumin: 4.5 g/dL (ref 3.5–5.0)
BILIRUBIN TOTAL: 0.6 mg/dL (ref 0.3–1.2)
BUN: 11 mg/dL (ref 6–20)
CALCIUM: 8.8 mg/dL — AB (ref 8.9–10.3)
CO2: 27 mmol/L (ref 22–32)
Chloride: 103 mmol/L (ref 101–111)
Creatinine, Ser: 0.73 mg/dL (ref 0.44–1.00)
GFR calc non Af Amer: >60 mL/min (ref >60)
Glucose, Bld: 101 mg/dL — ABNORMAL HIGH (ref 65–99)
Potassium: 3.8 mmol/L (ref 3.5–5.1)
Sodium: 136 mmol/L (ref 135–145)
TOTAL PROTEIN: 6.8 g/dL (ref 6.5–8.1)

## 2016-11-09 LAB — APTT: aPTT: 46 seconds — ABNORMAL HIGH (ref 24–36)

## 2016-11-09 LAB — ABO/RH: ABO/RH(D): A POS

## 2016-11-09 LAB — SURGICAL PCR SCREEN
MRSA, PCR: NEGATIVE
Staphylococcus aureus: NEGATIVE

## 2016-11-09 LAB — PROTIME-INR
INR: 0.93
Prothrombin Time: 12.5 seconds (ref 11.4–15.2)

## 2016-11-14 ENCOUNTER — Ambulatory Visit: Payer: Self-pay | Admitting: Orthopedic Surgery

## 2016-11-14 NOTE — H&P (Signed)
Kaylee Hull DOB: [birth year] Divorced / Language: [place] / Race: White Female Date of Admission:  11/15/2016 CC:  Right Hip Pain History of Present Illness The patient is a 71 year old female who comes in  for a preoperative History and Physical. The patient is scheduled for a right total hip arthroplasty (anterior) to be performed by Dr. Gus Rankin. Aluisio, MD at [facility] on 11-15-2016. The patient is a 71 year old female who presented for follow up of their hip. The patient is being followed for their right hip pain and osteoarthritis. They are year(s) out from when symptoms began. Symptoms reported include: pain (burning sensation), pain after sitting, aching and difficulty arising from chair. The patient feels that they are doing poorly and report their pain level to be moderate. The patient takes Gabapentin from Dr. Eloise Harman. Right hip is starting to bother her a little more now. She is not quite at a stage where it is hurting all the time or hurting at night, but it is bothering her more than it has in the past couple of years. She is starting to lose a little bit of motion with this also. AP pelvis, AP and lateral of the right hip do show that she has moderate arthritic change in the hip, but has been significantly progressive. It has progressed some since her last x-ray. She does have arthritic change with some progression in symptoms. It is felt that she would benefit from undergoing hip replacement. They have been treated conservatively in the past for the above stated problem and despite conservative measures, they continue to have progressive pain and severe functional limitations and dysfunction. They have failed non-operative management including home exercise, medications. It is felt that they would benefit from undergoing total joint replacement. Risks and benefits of the procedure have been discussed with the patient and they elect to proceed with surgery. There are no  active contraindications to surgery such as ongoing infection or rapidly progressive neurological disease.  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 Diabetes Mellitus  mother Drug / Alcohol Addiction  child First Degree Relatives  reported Heart Disease  Maternal Grandfather, Mother. mother Kidney disease  child Rheumatoid Arthritis  Paternal Grandmother. Liver Disease, Chronic  father  Social History Children  2 Current work status  retired Scientist, physiological weekly; does running / walking and other Exercises weekly; does running / walking Drug/Alcohol Rehab (Currently)  no Living situation  live alone Drug/Alcohol Rehab (Previously)  no Marital status  divorced Never consumed alcohol  02/25/2016: Never consumed alcohol Illicit drug use  no No history of drug/alcohol rehab  Not under pain contract  Number of flights of stairs before winded  2-3 Tobacco / smoke exposure  02/25/2016: no no Pain Contract  no Tobacco use  Former smoker. 02/25/2016: smoke(d) 2 pack(s) per day former smoker; smoke(d) 2 pack(s) per day Advance Directives  Living Will, Healthcare POA  Medication History Sertraline HCl (100MG  Tablet, Oral) Active. Simvastatin (40MG  Tablet, Oral) Active. Vitamin D Active. Synthroid ( Tablet, Oral) Active. Alendronate Sodium (70MG  Tablet, Oral) Active. Zolpidem Active.  Past Surgical History Colon Polyp Removal -  Colonoscopy  Tonsillectomy     Review of Systems General Not Present- Chills, Fatigue, Fever,  Memory Loss, Night Sweats, Weight Gain and Weight Loss. Skin Not Present- Eczema, Hives, Itching, Lesions and Rash. HEENT Not Present- Dentures, Double Vision, Headache, Hearing Loss, Tinnitus and Visual Loss. Respiratory Not Present- Allergies, Chronic Cough, Coughing up blood, Shortness of breath at rest and Shortness of breath with exertion. Cardiovascular Not Present- Chest Pain, Difficulty Breathing Lying Down, Murmur, Palpitations, Racing/skipping heartbeats and Swelling. Gastrointestinal Not Present- Abdominal Pain, Bloody Stool, Constipation, Diarrhea, Difficulty Swallowing, Heartburn, Jaundice, Loss of appetitie, Nausea and Vomiting. Female Genitourinary Present- Blood in Urine and Urinating at Night. Not Present- Discharge, Flank Pain, Incontinence, Painful Urination, Urgency, Urinary frequency, Urinary Retention and Weak urinary stream. Musculoskeletal Present- Joint Pain and Morning Stiffness. Not Present- Back Pain, Joint Swelling, Muscle Pain, Muscle Weakness and Spasms. Neurological Not Present- Blackout spells, Difficulty with balance, Dizziness, Paralysis, Tremor and Weakness. Psychiatric Present- Insomnia.  Vitals  Weight: 135 lb Height: 65in Weight was reported by patient. Height was reported by patient. Body Surface Area: 1.67 m Body Mass Index: 22.46 kg/m  Pulse: 60 (Regular)  BP: 138/72 (Sitting, Right Arm, Standard)   Physical Exam General Mental Status -Alert, cooperative and good historian. General Appearance-pleasant, Not in acute distress. Orientation-Oriented X3. Build & Nutrition-Well nourished and Well developed.  Head and Neck Head-normocephalic, atraumatic . Neck Global Assessment - supple, no bruit auscultated on the right, no bruit auscultated on the left.  Eye Vision-Wears corrective lenses. Pupil - Bilateral-Regular and Round. Motion - Bilateral-EOMI.  Chest and Lung Exam Auscultation Breath sounds - clear at  anterior chest wall and clear at posterior chest wall. Adventitious sounds - No Adventitious sounds.  Cardiovascular Auscultation Rhythm - Regular rate and rhythm. Heart Sounds - S1 WNL and S2 WNL. Murmurs & Other Heart Sounds - Auscultation of the heart reveals - No Murmurs.  Abdomen Palpation/Percussion Tenderness - Abdomen is non-tender to palpation. Rigidity (guarding) - Abdomen is soft. Auscultation Auscultation of the abdomen reveals - Bowel sounds normal.  Female Genitourinary Note: Not done, not pertinent to present illness   Musculoskeletal Note: She is alert and oriented, in no apparent distress. Evaluation of her right hip shows flexion to about 100, rotated in 20, out 30, abduction to 30 with some discomfort on range of motion. She has slightly antalgic gait.  RADIOGRAPHS AP pelvis, AP and lateral of the right hip do show that she has moderate arthritic change in the hip, but has been significantly progressive. It has progressed some since her last x-ray.   Assessment & Plan Primary osteoarthritis of right hip (M16.11)  Note:Surgical Plans: Right Total Hip Replacement - Anterior Approach  Disposition: Home  PCP: Dr. Eloise HarmanPaterson - Patient has been seen preoperatively and felt to be stable for surgery.  IV TXA  Anesthesia Issues: None  Signed electronically by Beckey RutterAlezandrew L Zaniyah Wernette, III PA-C

## 2016-11-15 ENCOUNTER — Encounter (HOSPITAL_COMMUNITY)
Admission: RE | Disposition: A | Discharge status: Home Health | Payer: Self-pay | Source: Ambulatory Visit | Attending: Orthopedic Surgery

## 2016-11-15 ENCOUNTER — Inpatient Hospital Stay (HOSPITAL_COMMUNITY)
Admission: RE | Admit: 2016-11-15 | Discharge: 2016-11-21 | DRG: 470 | Disposition: A | Discharge status: Home Health | Payer: Medicare Other | Source: Ambulatory Visit | Attending: Orthopedic Surgery | Admitting: Orthopedic Surgery

## 2016-11-15 ENCOUNTER — Inpatient Hospital Stay (HOSPITAL_COMMUNITY): Payer: Medicare Other | Admitting: Anesthesiology

## 2016-11-15 ENCOUNTER — Inpatient Hospital Stay (HOSPITAL_COMMUNITY): Payer: Medicare Other

## 2016-11-15 ENCOUNTER — Encounter (HOSPITAL_COMMUNITY): Payer: Self-pay

## 2016-11-15 DIAGNOSIS — R42 Dizziness and giddiness: Secondary | ICD-10-CM | POA: Diagnosis not present

## 2016-11-15 DIAGNOSIS — Z809 Family history of malignant neoplasm, unspecified: Secondary | ICD-10-CM

## 2016-11-15 DIAGNOSIS — E7800 Pure hypercholesterolemia, unspecified: Secondary | ICD-10-CM | POA: Diagnosis present

## 2016-11-15 DIAGNOSIS — Z87891 Personal history of nicotine dependence: Secondary | ICD-10-CM

## 2016-11-15 DIAGNOSIS — F419 Anxiety disorder, unspecified: Secondary | ICD-10-CM | POA: Diagnosis present

## 2016-11-15 DIAGNOSIS — R011 Cardiac murmur, unspecified: Secondary | ICD-10-CM | POA: Diagnosis present

## 2016-11-15 DIAGNOSIS — E785 Hyperlipidemia, unspecified: Secondary | ICD-10-CM | POA: Diagnosis present

## 2016-11-15 DIAGNOSIS — Z8249 Family history of ischemic heart disease and other diseases of the circulatory system: Secondary | ICD-10-CM | POA: Diagnosis not present

## 2016-11-15 DIAGNOSIS — Z881 Allergy status to other antibiotic agents status: Secondary | ICD-10-CM

## 2016-11-15 DIAGNOSIS — I951 Orthostatic hypotension: Secondary | ICD-10-CM | POA: Diagnosis not present

## 2016-11-15 DIAGNOSIS — Z8261 Family history of arthritis: Secondary | ICD-10-CM | POA: Diagnosis not present

## 2016-11-15 DIAGNOSIS — E039 Hypothyroidism, unspecified: Secondary | ICD-10-CM | POA: Diagnosis present

## 2016-11-15 DIAGNOSIS — F32A Depression, unspecified: Secondary | ICD-10-CM | POA: Diagnosis present

## 2016-11-15 DIAGNOSIS — M169 Osteoarthritis of hip, unspecified: Secondary | ICD-10-CM | POA: Diagnosis present

## 2016-11-15 DIAGNOSIS — Z8379 Family history of other diseases of the digestive system: Secondary | ICD-10-CM | POA: Diagnosis not present

## 2016-11-15 DIAGNOSIS — Z833 Family history of diabetes mellitus: Secondary | ICD-10-CM

## 2016-11-15 DIAGNOSIS — R110 Nausea: Secondary | ICD-10-CM | POA: Diagnosis not present

## 2016-11-15 DIAGNOSIS — E861 Hypovolemia: Secondary | ICD-10-CM | POA: Diagnosis not present

## 2016-11-15 DIAGNOSIS — D509 Iron deficiency anemia, unspecified: Secondary | ICD-10-CM | POA: Diagnosis present

## 2016-11-15 DIAGNOSIS — Z801 Family history of malignant neoplasm of trachea, bronchus and lung: Secondary | ICD-10-CM | POA: Diagnosis not present

## 2016-11-15 DIAGNOSIS — Z882 Allergy status to sulfonamides status: Secondary | ICD-10-CM | POA: Diagnosis not present

## 2016-11-15 DIAGNOSIS — M1611 Unilateral primary osteoarthritis, right hip: Secondary | ICD-10-CM | POA: Diagnosis present

## 2016-11-15 DIAGNOSIS — M810 Age-related osteoporosis without current pathological fracture: Secondary | ICD-10-CM | POA: Diagnosis present

## 2016-11-15 DIAGNOSIS — Z800 Family history of malignant neoplasm of digestive organs: Secondary | ICD-10-CM | POA: Diagnosis not present

## 2016-11-15 DIAGNOSIS — Z96649 Presence of unspecified artificial hip joint: Secondary | ICD-10-CM

## 2016-11-15 DIAGNOSIS — Z91030 Bee allergy status: Secondary | ICD-10-CM | POA: Diagnosis not present

## 2016-11-15 DIAGNOSIS — F329 Major depressive disorder, single episode, unspecified: Secondary | ICD-10-CM | POA: Diagnosis present

## 2016-11-15 DIAGNOSIS — Z811 Family history of alcohol abuse and dependence: Secondary | ICD-10-CM

## 2016-11-15 DIAGNOSIS — M25551 Pain in right hip: Secondary | ICD-10-CM | POA: Diagnosis present

## 2016-11-15 HISTORY — PX: TOTAL HIP ARTHROPLASTY: SHX124

## 2016-11-15 LAB — TYPE AND SCREEN
ABO/RH(D): A POS
Antibody Screen: NEGATIVE

## 2016-11-15 SURGERY — ARTHROPLASTY, HIP, TOTAL, ANTERIOR APPROACH
Anesthesia: Spinal | Site: Hip | Laterality: Right

## 2016-11-15 MED ORDER — MENTHOL 3 MG MT LOZG: 1.0000 | LOZENGE | OROMUCOSAL | Status: DC | PRN

## 2016-11-15 MED ORDER — DEXAMETHASONE SODIUM PHOSPHATE 10 MG/ML IJ SOLN
INTRAMUSCULAR | Status: AC
Start: 2016-11-15 — End: 2016-11-15
  Filled 2016-11-15: qty 1

## 2016-11-15 MED ORDER — SODIUM CHLORIDE 0.9 % IV SOLN
1000.0000 mg | Freq: Once | INTRAVENOUS | Status: AC
  Administered 2016-11-15: 1000 mg via INTRAVENOUS
  Filled 2016-11-15: qty 10

## 2016-11-15 MED ORDER — CEFAZOLIN SODIUM-DEXTROSE 2-4 GM/100ML-% IV SOLN
2.0000 g | INTRAVENOUS | Status: AC
  Administered 2016-11-15: 2 g via INTRAVENOUS
  Filled 2016-11-15: qty 100

## 2016-11-15 MED ORDER — SODIUM CHLORIDE 0.9 % IV SOLN
INTRAVENOUS | Status: DC
  Administered 2016-11-15: 23:00:00 via INTRAVENOUS
  Administered 2016-11-15: 1000 mL via INTRAVENOUS

## 2016-11-15 MED ORDER — FENTANYL CITRATE (PF) 100 MCG/2ML IJ SOLN
INTRAMUSCULAR | Status: AC
  Filled 2016-11-15: qty 2

## 2016-11-15 MED ORDER — 0.9 % SODIUM CHLORIDE (POUR BTL) OPTIME
TOPICAL | Status: DC | PRN
  Administered 2016-11-15: 1000 mL

## 2016-11-15 MED ORDER — PHENYLEPHRINE 40 MCG/ML (10ML) SYRINGE FOR IV PUSH (FOR BLOOD PRESSURE SUPPORT)
PREFILLED_SYRINGE | INTRAVENOUS | Status: AC
  Filled 2016-11-15: qty 10

## 2016-11-15 MED ORDER — CEFAZOLIN SODIUM-DEXTROSE 2-4 GM/100ML-% IV SOLN
2.0000 g | Freq: Four times a day (QID) | INTRAVENOUS | Status: AC
  Administered 2016-11-15 (×2): 2 g via INTRAVENOUS
  Filled 2016-11-15 (×2): qty 100

## 2016-11-15 MED ORDER — MEPERIDINE HCL 50 MG/ML IJ SOLN: 6.2500 mg | INTRAMUSCULAR | Status: DC | PRN

## 2016-11-15 MED ORDER — FLEET ENEMA 7-19 GM/118ML RE ENEM: 1.0000 | ENEMA | Freq: Once | RECTAL | Status: DC | PRN

## 2016-11-15 MED ORDER — MIDAZOLAM HCL 5 MG/5ML IJ SOLN
INTRAMUSCULAR | Status: DC | PRN
  Administered 2016-11-15: 2 mg via INTRAVENOUS

## 2016-11-15 MED ORDER — CHLORHEXIDINE GLUCONATE 4 % EX LIQD: 60.0000 mL | Freq: Once | CUTANEOUS | Status: DC

## 2016-11-15 MED ORDER — METHOCARBAMOL 1000 MG/10ML IJ SOLN
500.0000 mg | Freq: Four times a day (QID) | INTRAVENOUS | Status: DC | PRN
  Administered 2016-11-15: 500 mg via INTRAVENOUS
  Filled 2016-11-15: qty 5
  Filled 2016-11-15: qty 550

## 2016-11-15 MED ORDER — ONDANSETRON HCL 4 MG/2ML IJ SOLN
INTRAMUSCULAR | Status: DC | PRN
  Administered 2016-11-15: 4 mg via INTRAVENOUS

## 2016-11-15 MED ORDER — MEPERIDINE HCL 50 MG/ML IJ SOLN
6.2500 mg | INTRAMUSCULAR | Status: DC | PRN
  Administered 2016-11-15: 12.5 mg via INTRAVENOUS

## 2016-11-15 MED ORDER — CEFAZOLIN SODIUM-DEXTROSE 2-4 GM/100ML-% IV SOLN
INTRAVENOUS | Status: AC
  Filled 2016-11-15: qty 100

## 2016-11-15 MED ORDER — PROPOFOL 10 MG/ML IV BOLUS
INTRAVENOUS | Status: DC | PRN
  Administered 2016-11-15: 20 mg via INTRAVENOUS

## 2016-11-15 MED ORDER — BUPIVACAINE HCL (PF) 0.25 % IJ SOLN
INTRAMUSCULAR | Status: DC | PRN
  Administered 2016-11-15: 30 mL

## 2016-11-15 MED ORDER — EPHEDRINE 5 MG/ML INJ
INTRAVENOUS | Status: AC
  Filled 2016-11-15: qty 10

## 2016-11-15 MED ORDER — PROPOFOL 10 MG/ML IV BOLUS
INTRAVENOUS | Status: AC
  Filled 2016-11-15: qty 20

## 2016-11-15 MED ORDER — ACETAMINOPHEN 650 MG RE SUPP: 650.0000 mg | Freq: Four times a day (QID) | RECTAL | Status: DC | PRN

## 2016-11-15 MED ORDER — DIPHENHYDRAMINE HCL 12.5 MG/5ML PO ELIX: 12.5000 mg | ORAL_SOLUTION | ORAL | Status: DC | PRN

## 2016-11-15 MED ORDER — DOCUSATE SODIUM 100 MG PO CAPS
100.0000 mg | ORAL_CAPSULE | Freq: Two times a day (BID) | ORAL | Status: DC
  Administered 2016-11-15 – 2016-11-21 (×12): 100 mg via ORAL
  Filled 2016-11-15 (×12): qty 1

## 2016-11-15 MED ORDER — PHENOL 1.4 % MT LIQD
1.0000 | OROMUCOSAL | Status: DC | PRN
  Filled 2016-11-15: qty 177

## 2016-11-15 MED ORDER — SIMVASTATIN 20 MG PO TABS
40.0000 mg | ORAL_TABLET | Freq: Every evening | ORAL | Status: DC
  Administered 2016-11-15 – 2016-11-20 (×6): 40 mg via ORAL
  Filled 2016-11-15 (×6): qty 2

## 2016-11-15 MED ORDER — ACETAMINOPHEN 10 MG/ML IV SOLN
1000.0000 mg | Freq: Once | INTRAVENOUS | Status: AC
  Administered 2016-11-15: 1000 mg via INTRAVENOUS
  Filled 2016-11-15: qty 100

## 2016-11-15 MED ORDER — ZOLPIDEM TARTRATE 5 MG PO TABS
5.0000 mg | ORAL_TABLET | Freq: Every evening | ORAL | Status: DC | PRN
  Administered 2016-11-16 – 2016-11-19 (×4): 5 mg via ORAL
  Filled 2016-11-15 (×4): qty 1

## 2016-11-15 MED ORDER — PROPOFOL 500 MG/50ML IV EMUL
INTRAVENOUS | Status: DC | PRN
  Administered 2016-11-15: 75 ug/kg/min via INTRAVENOUS

## 2016-11-15 MED ORDER — HYDROMORPHONE HCL 1 MG/ML IJ SOLN
INTRAMUSCULAR | Status: AC
  Administered 2016-11-15: 0.5 mg via INTRAVENOUS
  Filled 2016-11-15: qty 1

## 2016-11-15 MED ORDER — DEXAMETHASONE SODIUM PHOSPHATE 10 MG/ML IJ SOLN
10.0000 mg | Freq: Once | INTRAMUSCULAR | Status: AC
  Administered 2016-11-16: 07:00:00 10 mg via INTRAVENOUS
  Filled 2016-11-15: qty 1

## 2016-11-15 MED ORDER — BISACODYL 10 MG RE SUPP
10.0000 mg | Freq: Every day | RECTAL | Status: DC | PRN
  Administered 2016-11-18: 10 mg via RECTAL
  Filled 2016-11-15: qty 1

## 2016-11-15 MED ORDER — ONDANSETRON HCL 4 MG/2ML IJ SOLN
INTRAMUSCULAR | Status: AC
  Filled 2016-11-15: qty 2

## 2016-11-15 MED ORDER — MORPHINE SULFATE (PF) 2 MG/ML IV SOLN: 1.0000 mg | INTRAVENOUS | Status: DC | PRN

## 2016-11-15 MED ORDER — PHENYLEPHRINE 40 MCG/ML (10ML) SYRINGE FOR IV PUSH (FOR BLOOD PRESSURE SUPPORT)
PREFILLED_SYRINGE | INTRAVENOUS | Status: DC | PRN
  Administered 2016-11-15: 80 ug via INTRAVENOUS
  Administered 2016-11-15 (×2): 40 ug via INTRAVENOUS
  Administered 2016-11-15: 80 ug via INTRAVENOUS
  Administered 2016-11-15: 40 ug via INTRAVENOUS

## 2016-11-15 MED ORDER — HYDROMORPHONE HCL 1 MG/ML IJ SOLN: 0.2500 mg | INTRAMUSCULAR | Status: DC | PRN

## 2016-11-15 MED ORDER — ACETAMINOPHEN 10 MG/ML IV SOLN
INTRAVENOUS | Status: AC
  Filled 2016-11-15: qty 100

## 2016-11-15 MED ORDER — SERTRALINE HCL 100 MG PO TABS
100.0000 mg | ORAL_TABLET | Freq: Every day | ORAL | Status: DC
  Administered 2016-11-16 – 2016-11-21 (×6): 100 mg via ORAL
  Filled 2016-11-15 (×6): qty 1

## 2016-11-15 MED ORDER — PROMETHAZINE HCL 25 MG/ML IJ SOLN: 6.2500 mg | INTRAMUSCULAR | Status: DC | PRN

## 2016-11-15 MED ORDER — TRANEXAMIC ACID 1000 MG/10ML IV SOLN
1000.0000 mg | INTRAVENOUS | Status: AC
  Administered 2016-11-15: 1000 mg via INTRAVENOUS
  Filled 2016-11-15: qty 1100

## 2016-11-15 MED ORDER — METOCLOPRAMIDE HCL 5 MG PO TABS
5.0000 mg | ORAL_TABLET | Freq: Three times a day (TID) | ORAL | Status: DC | PRN
Start: 2016-11-15 — End: 2016-11-21

## 2016-11-15 MED ORDER — METOCLOPRAMIDE HCL 5 MG/ML IJ SOLN: 5.0000 mg | Freq: Three times a day (TID) | INTRAMUSCULAR | Status: DC | PRN

## 2016-11-15 MED ORDER — RIVAROXABAN 10 MG PO TABS
10.0000 mg | ORAL_TABLET | Freq: Every day | ORAL | Status: DC
  Administered 2016-11-16 – 2016-11-21 (×6): 10 mg via ORAL
  Filled 2016-11-15 (×6): qty 1

## 2016-11-15 MED ORDER — FENTANYL CITRATE (PF) 100 MCG/2ML IJ SOLN
INTRAMUSCULAR | Status: DC | PRN
  Administered 2016-11-15: 75 ug via INTRAVENOUS
  Administered 2016-11-15: 25 ug via INTRAVENOUS

## 2016-11-15 MED ORDER — ACETAMINOPHEN 500 MG PO TABS
1000.0000 mg | ORAL_TABLET | Freq: Four times a day (QID) | ORAL | Status: AC
  Administered 2016-11-15 – 2016-11-16 (×4): 1000 mg via ORAL
  Filled 2016-11-15 (×4): qty 2

## 2016-11-15 MED ORDER — ONDANSETRON HCL 4 MG/2ML IJ SOLN: 4.0000 mg | Freq: Four times a day (QID) | INTRAMUSCULAR | Status: DC | PRN

## 2016-11-15 MED ORDER — BUPIVACAINE IN DEXTROSE 0.75-8.25 % IT SOLN
INTRATHECAL | Status: DC | PRN
  Administered 2016-11-15: 1.8 mL via INTRATHECAL

## 2016-11-15 MED ORDER — LACTATED RINGERS IV SOLN
INTRAVENOUS | Status: DC
  Administered 2016-11-15 (×2): via INTRAVENOUS

## 2016-11-15 MED ORDER — HYDROMORPHONE HCL 1 MG/ML IJ SOLN
0.2500 mg | INTRAMUSCULAR | Status: DC | PRN
  Administered 2016-11-15 (×2): 0.5 mg via INTRAVENOUS

## 2016-11-15 MED ORDER — EPHEDRINE SULFATE-NACL 50-0.9 MG/10ML-% IV SOSY
PREFILLED_SYRINGE | INTRAVENOUS | Status: DC | PRN
  Administered 2016-11-15: 10 mg via INTRAVENOUS
  Administered 2016-11-15: 5 mg via INTRAVENOUS
  Administered 2016-11-15 (×3): 10 mg via INTRAVENOUS

## 2016-11-15 MED ORDER — LEVOTHYROXINE SODIUM 50 MCG PO TABS
50.0000 ug | ORAL_TABLET | Freq: Every day | ORAL | Status: DC
  Administered 2016-11-16 – 2016-11-21 (×6): 50 ug via ORAL
  Filled 2016-11-15 (×6): qty 1

## 2016-11-15 MED ORDER — DEXAMETHASONE SODIUM PHOSPHATE 10 MG/ML IJ SOLN
10.0000 mg | Freq: Once | INTRAMUSCULAR | Status: AC
  Administered 2016-11-15: 10 mg via INTRAVENOUS

## 2016-11-15 MED ORDER — OXYCODONE HCL 5 MG PO TABS
5.0000 mg | ORAL_TABLET | ORAL | Status: DC | PRN
  Administered 2016-11-15 – 2016-11-16 (×5): 5 mg via ORAL
  Filled 2016-11-15 (×7): qty 1

## 2016-11-15 MED ORDER — BUPIVACAINE HCL 0.25 % IJ SOLN
INTRAMUSCULAR | Status: AC
  Filled 2016-11-15: qty 1

## 2016-11-15 MED ORDER — METHOCARBAMOL 500 MG PO TABS: 500.0000 mg | ORAL_TABLET | Freq: Four times a day (QID) | ORAL | Status: DC | PRN

## 2016-11-15 MED ORDER — ONDANSETRON HCL 4 MG PO TABS
4.0000 mg | ORAL_TABLET | Freq: Four times a day (QID) | ORAL | Status: DC | PRN
  Administered 2016-11-16: 4 mg via ORAL
  Filled 2016-11-15: qty 1

## 2016-11-15 MED ORDER — ACETAMINOPHEN 325 MG PO TABS
650.0000 mg | ORAL_TABLET | Freq: Four times a day (QID) | ORAL | Status: DC | PRN
  Administered 2016-11-16 – 2016-11-21 (×2): 650 mg via ORAL
  Filled 2016-11-15 (×2): qty 2

## 2016-11-15 MED ORDER — PROPOFOL 10 MG/ML IV BOLUS
INTRAVENOUS | Status: AC
  Filled 2016-11-15: qty 40

## 2016-11-15 MED ORDER — MEPERIDINE HCL 50 MG/ML IJ SOLN
INTRAMUSCULAR | Status: AC
  Filled 2016-11-15: qty 1

## 2016-11-15 MED ORDER — POLYETHYLENE GLYCOL 3350 17 G PO PACK: 17.0000 g | PACK | Freq: Every day | ORAL | Status: DC | PRN

## 2016-11-15 MED ORDER — MIDAZOLAM HCL 2 MG/2ML IJ SOLN
INTRAMUSCULAR | Status: AC
  Filled 2016-11-15: qty 2

## 2016-11-15 SURGICAL SUPPLY — 35 items
BAG DECANTER FOR FLEXI CONT (MISCELLANEOUS) ×3 IMPLANT
BAG ZIPLOCK 12X15 (MISCELLANEOUS) IMPLANT
BLADE SAG 18X100X1.27 (BLADE) ×3 IMPLANT
CAPT HIP TOTAL 2 ×3 IMPLANT
CLOSURE WOUND 1/2 X4 (GAUZE/BANDAGES/DRESSINGS) ×1
CLOTH BEACON ORANGE TIMEOUT ST (SAFETY) ×3 IMPLANT
COVER PERINEAL POST (MISCELLANEOUS) ×3 IMPLANT
DECANTER SPIKE VIAL GLASS SM (MISCELLANEOUS) ×3 IMPLANT
DRAPE STERI IOBAN 125X83 (DRAPES) ×3 IMPLANT
DRAPE U-SHAPE 47X51 STRL (DRAPES) ×6 IMPLANT
DRSG ADAPTIC 3X8 NADH LF (GAUZE/BANDAGES/DRESSINGS) ×3 IMPLANT
DRSG MEPILEX BORDER 4X4 (GAUZE/BANDAGES/DRESSINGS) ×3 IMPLANT
DRSG MEPILEX BORDER 4X8 (GAUZE/BANDAGES/DRESSINGS) ×3 IMPLANT
DURAPREP 26ML APPLICATOR (WOUND CARE) ×3 IMPLANT
ELECT REM PT RETURN 9FT ADLT (ELECTROSURGICAL) ×3
ELECTRODE REM PT RTRN 9FT ADLT (ELECTROSURGICAL) ×1 IMPLANT
EVACUATOR 1/8 PVC DRAIN (DRAIN) ×3 IMPLANT
GLOVE BIO SURGEON STRL SZ7.5 (GLOVE) ×3 IMPLANT
GLOVE BIO SURGEON STRL SZ8 (GLOVE) ×6 IMPLANT
GLOVE BIOGEL PI IND STRL 8 (GLOVE) ×2 IMPLANT
GLOVE BIOGEL PI INDICATOR 8 (GLOVE) ×4
GOWN STRL REUS W/TWL LRG LVL3 (GOWN DISPOSABLE) ×3 IMPLANT
GOWN STRL REUS W/TWL XL LVL3 (GOWN DISPOSABLE) ×3 IMPLANT
PACK ANTERIOR HIP CUSTOM (KITS) ×3 IMPLANT
STRIP CLOSURE SKIN 1/2X4 (GAUZE/BANDAGES/DRESSINGS) ×2 IMPLANT
SUT ETHIBOND NAB CT1 #1 30IN (SUTURE) ×3 IMPLANT
SUT MNCRL AB 4-0 PS2 18 (SUTURE) ×3 IMPLANT
SUT VIC AB 2-0 CT1 27 (SUTURE) ×4
SUT VIC AB 2-0 CT1 TAPERPNT 27 (SUTURE) ×2 IMPLANT
SUT VLOC 180 0 24IN GS25 (SUTURE) ×3 IMPLANT
SYR 50ML LL SCALE MARK (SYRINGE) IMPLANT
TRAY FOLEY W/METER SILVER 14FR (SET/KITS/TRAYS/PACK) ×3 IMPLANT
TRAY FOLEY W/METER SILVER 16FR (SET/KITS/TRAYS/PACK) IMPLANT
WATER STERILE IRR 1000ML POUR (IV SOLUTION) ×6 IMPLANT
YANKAUER SUCT BULB TIP 10FT TU (MISCELLANEOUS) ×3 IMPLANT

## 2016-11-15 NOTE — Transfer of Care (Signed)
Immediate Anesthesia Transfer of Care Note  Patient: Kaylee Hull  Procedure(s) Performed: Procedure(s): RIGHT TOTAL HIP ARTHROPLASTY ANTERIOR APPROACH (Right)  Patient Location: PACU  Anesthesia Type:Spinal  Level of Consciousness:  sedated, patient cooperative and responds to stimulation  Airway & Oxygen Therapy:Patient Spontanous Breathing and Patient connected to face mask oxgen  Post-op Assessment:  Report given to PACU RN and Post -op Vital signs reviewed and stable  Post vital signs:  Reviewed and stable  Last Vitals:  Vitals:   11/15/16 0715  BP: 133/81  Pulse: 78  Resp: 16  Temp: 36.9 C    Complications: No apparent anesthesia complications

## 2016-11-15 NOTE — Interval H&P Note (Signed)
History and Physical Interval Note:  11/15/2016 10:25 AM  Kaylee Hull  has presented today for surgery, with the diagnosis of RIGHT HIP OA  The various methods of treatment have been discussed with the patient and family. After consideration of risks, benefits and other options for treatment, the patient has consented to  Procedure(s): RIGHT TOTAL HIP ARTHROPLASTY ANTERIOR APPROACH (Right) as a surgical intervention .  The patient's history has been reviewed, patient examined, no change in status, stable for surgery.  I have reviewed the patient's chart and labs.  Questions were answered to the patient's satisfaction.     Loanne DrillingALUISIO,Rowen Hur V

## 2016-11-15 NOTE — Anesthesia Preprocedure Evaluation (Signed)
Anesthesia Evaluation  Patient identified by MRN, date of birth, ID band Patient awake    Reviewed: Allergy & Precautions, NPO status , Patient's Chart, lab work & pertinent test results  Airway Mallampati: II  TM Distance: >3 FB Neck ROM: Full    Dental no notable dental hx.    Pulmonary former smoker,    Pulmonary exam normal breath sounds clear to auscultation       Cardiovascular negative cardio ROS Normal cardiovascular exam+ Valvular Problems/Murmurs  Rhythm:Regular Rate:Normal     Neuro/Psych PSYCHIATRIC DISORDERS Anxiety Depression negative neurological ROS     GI/Hepatic negative GI ROS, Neg liver ROS,   Endo/Other  negative endocrine ROSHypothyroidism   Renal/GU negative Renal ROS     Musculoskeletal  (+) Arthritis ,   Abdominal   Peds  Hematology negative hematology ROS (+) anemia ,   Anesthesia Other Findings   Reproductive/Obstetrics negative OB ROS                             Anesthesia Physical Anesthesia Plan  ASA: II  Anesthesia Plan: Spinal   Post-op Pain Management:    Induction: Intravenous  Airway Management Planned:   Additional Equipment:   Intra-op Plan:   Post-operative Plan: Extubation in OR  Informed Consent: I have reviewed the patients History and Physical, chart, labs and discussed the procedure including the risks, benefits and alternatives for the proposed anesthesia with the patient or authorized representative who has indicated his/her understanding and acceptance.   Dental advisory given  Plan Discussed with: CRNA  Anesthesia Plan Comments:         Anesthesia Quick Evaluation

## 2016-11-15 NOTE — Anesthesia Procedure Notes (Addendum)
Spinal  Patient location during procedure: OR Start time: 11/15/2016 10:33 AM End time: 11/15/2016 10:43 AM Staffing Anesthesiologist: Lewie LoronGERMEROTH, Simran Mannis Resident/CRNA: KEY, KRISTOPHER Performed: anesthesiologist  Preanesthetic Checklist Completed: patient identified, site marked, surgical consent, pre-op evaluation, timeout performed, IV checked, risks and benefits discussed and monitors and equipment checked Spinal Block Patient position: sitting Prep: DuraPrep Patient monitoring: heart rate, cardiac monitor, continuous pulse ox and blood pressure Approach: right paramedian Location: L3-4 Injection technique: single-shot Needle Needle type: Spinocan  Needle gauge: 22 G Needle length: 9 cm Needle insertion depth: 7 cm Assessment Sensory level: T6 Additional Notes -heme, -para, VSS.  Pt tol well.  Exp date and lot number [id number].

## 2016-11-15 NOTE — Anesthesia Postprocedure Evaluation (Signed)
Anesthesia Post Note  Patient: Kaylee Hull  Procedure(s) Performed: Procedure(s) (LRB): RIGHT TOTAL HIP ARTHROPLASTY ANTERIOR APPROACH (Right)  Patient location during evaluation: PACU Anesthesia Type: Spinal Level of consciousness: awake and alert Pain management: pain level controlled Vital Signs Assessment: post-procedure vital signs reviewed and stable Respiratory status: spontaneous breathing and respiratory function stable Cardiovascular status: blood pressure returned to baseline and stable Postop Assessment: spinal receding Anesthetic complications: no       Last Vitals:  Vitals:   11/15/16 1345 11/15/16 1415  BP: 133/72   Pulse: 65 73  Resp: 15 16  Temp: 36.4 C     Last Pain:  Vitals:   11/15/16 1415  TempSrc:   PainSc: 3                  Lewie LoronJohn Dontavian Marchi

## 2016-11-15 NOTE — Op Note (Signed)
OPERATIVE REPORT- TOTAL HIP ARTHROPLASTY   PREOPERATIVE DIAGNOSIS: Osteoarthritis of the Right hip.   POSTOPERATIVE DIAGNOSIS: Osteoarthritis of the Right  hip.   PROCEDURE: Right total hip arthroplasty, anterior approach.   SURGEON: Ollen GrossFrank Linwood Gullikson, MD   ASSISTANT: Skip MayerBlair Roberts, PA-C  ANESTHESIA:  Spinal  ESTIMATED BLOOD LOSS: 350 ml    DRAINS: Hemovac x1.   COMPLICATIONS: None   CONDITION: PACU - hemodynamically stable.   BRIEF CLINICAL NOTE: Kaylee PaceLucille B Duet is a 71 y.o. female who has advanced end-  stage arthritis of their Right  hip with progressively worsening pain and  dysfunction.The patient has failed nonoperative management and presents for  total hip arthroplasty.   PROCEDURE IN DETAIL: After successful administration of spinal  anesthetic, the traction boots for the [facility] bed were placed on both  feet and the patient was [facility] the [facility], boots placed into the leg  holders. The Right hip was then isolated from the perineum with plastic  drapes and prepped and draped in the usual sterile fashion. ASIS and  greater trochanter were marked and a oblique incision was made, starting  at about 1 cm lateral and 2 cm distal to the ASIS and coursing towards  the anterior cortex of the femur. The skin was cut with a 10 blade  through subcutaneous tissue to the level of the fascia overlying the  tensor fascia lata muscle. The fascia was then incised in line with the  incision at the junction of the anterior third and posterior 2/3rd. The  muscle was teased off the fascia and then the interval between the TFL  and the rectus was developed. The Hohmann retractor was then placed at  the top of the femoral neck over the capsule. The vessels overlying the  capsule were cauterized and the fat on top of the capsule was removed.  A Hohmann retractor was then placed anterior underneath the rectus  femoris to give exposure to the entire anterior capsule. A T-shaped   capsulotomy was performed. The edges were tagged and the femoral head  was identified.       Osteophytes are removed off the superior acetabulum.  The femoral neck was then cut in situ with an oscillating saw. Traction  was then applied to the left lower extremity utilizing the [facility]  traction. The femoral head was then removed. Retractors were placed  around the acetabulum and then circumferential removal of the labrum was  performed. Osteophytes were also removed. Reaming starts at 45 mm to  medialize and  Increased in 2 mm increments to 47 mm. We reamed in  approximately 40 degrees of abduction, 20 degrees anteversion. A 48 mm  pinnacle acetabular shell was then impacted in anatomic position under  fluoroscopic guidance with excellent purchase. We did not need to place  any additional dome screws. A 28 mm neutral + 4 marathon liner was then  placed into the acetabular shell.       The femoral lift was then placed along the lateral aspect of the femur  just distal to the vastus ridge. The leg was  externally rotated and capsule  was stripped off the inferior aspect of the femoral neck down to the  level of the lesser trochanter, this was done with electrocautery. The femur was lifted after this was performed. The  leg was then placed in an extended and adducted position essentially delivering the femur. We also removed the capsule superiorly and the piriformis from the  piriformis fossa to gain excellent exposure of the  proximal femur. Rongeur was used to remove some cancellous bone to get  into the lateral portion of the proximal femur for placement of the  initial starter reamer. The starter broaches was placed  the starter broach  and was shown to go down the center of the canal. Broaching  with the  Corail system was then performed starting at size 8, coursing  Up to size 9. A size 9 had excellent torsional and rotational  and axial stability. The trial high offset neck was then placed   with a 28 + 1.5 trial head. The hip was then reduced. We confirmed that  the stem was in the canal both on AP and lateral x-rays. It also has excellent sizing. The hip was reduced with outstanding stability through full extension and full external rotation.. AP pelvis was taken and the leg lengths were measured and found to be equal. Hip was then dislocated again and the femoral head and neck removed. The  femoral broach was removed. Size 9 Corail stem with a high offset  neck was then impacted into the femur following native anteversion. Has  excellent purchase in the canal. Excellent torsional and rotational and  axial stability. It is confirmed to be in the canal on AP and lateral  fluoroscopic views. The 28 + 1.5 ceramic head was placed and the hip  reduced with outstanding stability. Again AP pelvis was taken and it  confirmed that the leg lengths were equal. The wound was then copiously  irrigated with saline solution and the capsule reattached and repaired  with Ethibond suture. 30 ml of .25% Bupivicaine was  injected into the capsule and into the edge of the tensor fascia lata as well as subcutaneous tissue. The fascia overlying the tensor fascia lata was then closed with a running #1 V-Loc. Subcu was closed with interrupted 2-0 Vicryl and subcuticular running 4-0 Monocryl. Incision was cleaned  and dried. Steri-Strips and a bulky sterile dressing applied. Hemovac  drain was hooked to suction and then the patient was awakened and transported to  recovery in stable condition.        Please note that a surgical assistant was a medical necessity for this procedure to perform it in a safe and expeditious manner. Assistant was necessary to provide appropriate retraction of vital neurovascular structures and to prevent femoral fracture and allow for anatomic placement of the prosthesis.  Gaynelle Arabian, M.D.

## 2016-11-15 NOTE — H&P (View-Only) (Signed)
Kaylee Hull DOB: [birth year] Divorced / Language: [place] / Race: White Female Date of Admission:  11/15/2016 CC:  Right Hip Pain History of Present Illness The patient is a 71 year old female who comes in  for a preoperative History and Physical. The patient is scheduled for a right total hip arthroplasty (anterior) to be performed by Dr. Gus Rankin. Aluisio, MD at [facility] on 11-15-2016. The patient is a 71 year old female who presented for follow up of their hip. The patient is being followed for their right hip pain and osteoarthritis. They are year(s) out from when symptoms began. Symptoms reported include: pain (burning sensation), pain after sitting, aching and difficulty arising from chair. The patient feels that they are doing poorly and report their pain level to be moderate. The patient takes Gabapentin from Dr. Eloise Harman. Right hip is starting to bother her a little more now. She is not quite at a stage where it is hurting all the time or hurting at night, but it is bothering her more than it has in the past couple of years. She is starting to lose a little bit of motion with this also. AP pelvis, AP and lateral of the right hip do show that she has moderate arthritic change in the hip, but has been significantly progressive. It has progressed some since her last x-ray. She does have arthritic change with some progression in symptoms. It is felt that she would benefit from undergoing hip replacement. They have been treated conservatively in the past for the above stated problem and despite conservative measures, they continue to have progressive pain and severe functional limitations and dysfunction. They have failed non-operative management including home exercise, medications. It is felt that they would benefit from undergoing total joint replacement. Risks and benefits of the procedure have been discussed with the patient and they elect to proceed with surgery. There are no  active contraindications to surgery such as ongoing infection or rapidly progressive neurological disease.  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 Diabetes Mellitus  mother Drug / Alcohol Addiction  child First Degree Relatives  reported Heart Disease  Maternal Grandfather, Mother. mother Kidney disease  child Rheumatoid Arthritis  Paternal Grandmother. Liver Disease, Chronic  father  Social History Children  2 Current work status  retired Scientist, physiological weekly; does running / walking and other Exercises weekly; does running / walking Drug/Alcohol Rehab (Currently)  no Living situation  live alone Drug/Alcohol Rehab (Previously)  no Marital status  divorced Never consumed alcohol  02/25/2016: Never consumed alcohol Illicit drug use  no No history of drug/alcohol rehab  Not under pain contract  Number of flights of stairs before winded  2-3 Tobacco / smoke exposure  02/25/2016: no no Pain Contract  no Tobacco use  Former smoker. 02/25/2016: smoke(d) 2 pack(s) per day former smoker; smoke(d) 2 pack(s) per day Advance Directives  Living Will, Healthcare POA  Medication History Sertraline HCl (100MG  Tablet, Oral) Active. Simvastatin (40MG  Tablet, Oral) Active. Vitamin D Active. Synthroid ( Tablet, Oral) Active. Alendronate Sodium (70MG  Tablet, Oral) Active. Zolpidem Active.  Past Surgical History Colon Polyp Removal -  Colonoscopy  Tonsillectomy     Review of Systems General Not Present- Chills, Fatigue, Fever,  Memory Loss, Night Sweats, Weight Gain and Weight Loss. Skin Not Present- Eczema, Hives, Itching, Lesions and Rash. HEENT Not Present- Dentures, Double Vision, Headache, Hearing Loss, Tinnitus and Visual Loss. Respiratory Not Present- Allergies, Chronic Cough, Coughing up blood, Shortness of breath at rest and Shortness of breath with exertion. Cardiovascular Not Present- Chest Pain, Difficulty Breathing Lying Down, Murmur, Palpitations, Racing/skipping heartbeats and Swelling. Gastrointestinal Not Present- Abdominal Pain, Bloody Stool, Constipation, Diarrhea, Difficulty Swallowing, Heartburn, Jaundice, Loss of appetitie, Nausea and Vomiting. Female Genitourinary Present- Blood in Urine and Urinating at Night. Not Present- Discharge, Flank Pain, Incontinence, Painful Urination, Urgency, Urinary frequency, Urinary Retention and Weak urinary stream. Musculoskeletal Present- Joint Pain and Morning Stiffness. Not Present- Back Pain, Joint Swelling, Muscle Pain, Muscle Weakness and Spasms. Neurological Not Present- Blackout spells, Difficulty with balance, Dizziness, Paralysis, Tremor and Weakness. Psychiatric Present- Insomnia.  Vitals  Weight: 135 lb Height: 65in Weight was reported by patient. Height was reported by patient. Body Surface Area: 1.67 m Body Mass Index: 22.46 kg/m  Pulse: 60 (Regular)  BP: 138/72 (Sitting, Right Arm, Standard)   Physical Exam General Mental Status -Alert, cooperative and good historian. General Appearance-pleasant, Not in acute distress. Orientation-Oriented X3. Build & Nutrition-Well nourished and Well developed.  Head and Neck Head-normocephalic, atraumatic . Neck Global Assessment - supple, no bruit auscultated on the right, no bruit auscultated on the left.  Eye Vision-Wears corrective lenses. Pupil - Bilateral-Regular and Round. Motion - Bilateral-EOMI.  Chest and Lung Exam Auscultation Breath sounds - clear at  anterior chest wall and clear at posterior chest wall. Adventitious sounds - No Adventitious sounds.  Cardiovascular Auscultation Rhythm - Regular rate and rhythm. Heart Sounds - S1 WNL and S2 WNL. Murmurs & Other Heart Sounds - Auscultation of the heart reveals - No Murmurs.  Abdomen Palpation/Percussion Tenderness - Abdomen is non-tender to palpation. Rigidity (guarding) - Abdomen is soft. Auscultation Auscultation of the abdomen reveals - Bowel sounds normal.  Female Genitourinary Note: Not done, not pertinent to present illness   Musculoskeletal Note: She is alert and oriented, in no apparent distress. Evaluation of her right hip shows flexion to about 100, rotated in 20, out 30, abduction to 30 with some discomfort on range of motion. She has slightly antalgic gait.  RADIOGRAPHS AP pelvis, AP and lateral of the right hip do show that she has moderate arthritic change in the hip, but has been significantly progressive. It has progressed some since her last x-ray.   Assessment & Plan Primary osteoarthritis of right hip (M16.11)  Note:Surgical Plans: Right Total Hip Replacement - Anterior Approach  Disposition: Home  PCP: Dr. Eloise HarmanPaterson - Patient has been seen preoperatively and felt to be stable for surgery.  IV TXA  Anesthesia Issues: None  Signed electronically by Beckey RutterAlezandrew L Simra Fiebig, III PA-C

## 2016-11-16 LAB — CBC
HCT: 27.5 % — ABNORMAL LOW (ref 36.0–46.0)
HEMOGLOBIN: 9.5 g/dL — AB (ref 12.0–15.0)
MCH: 31.1 pg (ref 26.0–34.0)
MCHC: 34.5 g/dL (ref 30.0–36.0)
MCV: 90.2 fL (ref 78.0–100.0)
PLATELETS: 181 10*3/uL (ref 150–400)
RBC: 3.05 MIL/uL — AB (ref 3.87–5.11)
RDW: 13.2 % (ref 11.5–15.5)
WBC: 6 10*3/uL (ref 4.0–10.5)

## 2016-11-16 LAB — BASIC METABOLIC PANEL
Anion gap: 5 (ref 5–15)
BUN: 7 mg/dL (ref 6–20)
CHLORIDE: 103 mmol/L (ref 101–111)
CO2: 26 mmol/L (ref 22–32)
Calcium: 8.1 mg/dL — ABNORMAL LOW (ref 8.9–10.3)
Creatinine, Ser: 0.68 mg/dL (ref 0.44–1.00)
Glucose, Bld: 117 mg/dL — ABNORMAL HIGH (ref 65–99)
POTASSIUM: 3.8 mmol/L (ref 3.5–5.1)
SODIUM: 134 mmol/L — AB (ref 135–145)

## 2016-11-16 MED ORDER — METHOCARBAMOL 500 MG PO TABS: 500.0000 mg | ORAL_TABLET | Freq: Four times a day (QID) | ORAL | 0 refills | Status: DC | PRN

## 2016-11-16 MED ORDER — RIVAROXABAN 10 MG PO TABS: 10.0000 mg | ORAL_TABLET | Freq: Every day | ORAL | 0 refills | Status: DC

## 2016-11-16 MED ORDER — OXYCODONE HCL 5 MG PO TABS: 5.0000 mg | ORAL_TABLET | ORAL | 0 refills | Status: DC | PRN

## 2016-11-16 MED ORDER — SODIUM CHLORIDE 0.9 % IV BOLUS (SEPSIS)
250.0000 mL | Freq: Once | INTRAVENOUS | Status: AC
  Administered 2016-11-16: 15:00:00 250 mL via INTRAVENOUS

## 2016-11-16 NOTE — Evaluation (Signed)
Physical Therapy Evaluation Patient Details Name: Kaylee Hull MRN: [id number] DOB: Dec 18, 1945 Today's Date: 11/16/2016   History of Present Illness  Pt s/p R THR  Clinical Impression  Pt s/p R THR presents with decreased R LE strength/ROM and post op pain limiting functional mobility.  Pt should progress to dc home with family assist and HHPT follow up.    Follow Up Recommendations Home health PT    Equipment Recommendations  Rolling walker with 5" wheels    Recommendations for Other Services OT consult     Precautions / Restrictions Precautions Precautions: Fall Restrictions Weight Bearing Restrictions: No Other Position/Activity Restrictions: WBAT      Mobility  Bed Mobility               General bed mobility comments: OOB with RN and requests to stay in chair  Transfers Overall transfer level: Needs assistance Equipment used: Rolling walker (2 wheeled) Transfers: Sit to/from Stand Sit to Stand: Min assist         General transfer comment: cues for LE management and use of UEs to self assist  Ambulation/Gait Ambulation/Gait assistance: Min assist;Min guard Ambulation Distance (Feet): 200 Feet Assistive device: Rolling walker (2 wheeled) Gait Pattern/deviations: Step-to pattern;Step-through pattern;Decreased step length - right;Decreased step length - left;Shuffle;Trunk flexed Gait velocity: decr Gait velocity interpretation: Below normal speed for age/gender General Gait Details: cues for sequence, posture and position from [employer]  Stairs            Wheelchair Mobility    Modified Rankin (Stroke Patients Only)       Balance Overall balance assessment: No apparent balance deficits (not formally assessed)                                           Pertinent Vitals/Pain Pain Assessment: 0-10 Pain Score: 3  Pain Location: R hip Pain Descriptors / Indicators: Aching;Sore;Tightness Pain Intervention(s): Premedicated  before session;Monitored during session;Limited activity within patient's tolerance;Ice applied    Home Living Family/patient expects to be discharged to:: Private residence Living Arrangements: Other relatives;Non-relatives/Friends Available Help at Discharge: Family Type of Home: House Home Access: Stairs to enter Entrance Stairs-Rails: None Entrance Stairs-Number of Steps: 1+2 Home Layout: One level Home Equipment: Cane - single point      Prior Function Level of Independence: Independent               Hand Dominance        Extremity/Trunk Assessment   Upper Extremity Assessment Upper Extremity Assessment: Overall WFL for tasks assessed    Lower Extremity Assessment Lower Extremity Assessment: RLE deficits/detail RLE Deficits / Details: Strength at hip 2+/5 with AAROM at hip to 95 flex and 20 abd    Cervical / Trunk Assessment Cervical / Trunk Assessment: Normal  Communication   Communication: No difficulties  Cognition Arousal/Alertness: Awake/alert Behavior During Therapy: WFL for tasks assessed/performed Overall Cognitive Status: Within Functional Limits for tasks assessed                      General Comments      Exercises Total Joint Exercises Ankle Circles/Pumps: AROM;Both;15 reps;Supine Quad Sets: AROM;Both;10 reps;Supine Heel Slides: AAROM;Right;20 reps;Supine Hip ABduction/ADduction: AAROM;Right;15 reps;Supine   Assessment/Plan    PT Assessment Patient needs continued PT services  PT Problem List Decreased strength;Decreased range of motion;Decreased activity tolerance;Decreased coordination;Decreased knowledge of use of  DME;Decreased mobility;Pain          PT Treatment Interventions DME instruction;Gait training;Stair training;Functional mobility training;Therapeutic activities;Therapeutic exercise;Patient/family education    PT Goals (Current goals can be found in the Care Plan section)  Acute Rehab PT Goals Patient Stated  Goal: Regain IND and walk without pain PT Goal Formulation: With patient Time For Goal Achievement: 11/18/16 Potential to Achieve Goals: Good    Frequency 7X/week   Barriers to discharge        Co-evaluation               End of Session Equipment Utilized During Treatment: Gait belt Activity Tolerance: Patient tolerated treatment well Patient left: in chair;with call bell/phone within reach Nurse Communication: Mobility status         Time: 1610-9604 PT Time Calculation (min) (ACUTE ONLY): 38 min   Charges:   PT Evaluation $PT Eval Low Complexity: 1 Procedure PT Treatments $Gait Training: 8-22 mins $Therapeutic Exercise: 8-22 mins   PT G Codes:        Justinian Miano December 11, 2016, 1:21 PM

## 2016-11-16 NOTE — Progress Notes (Signed)
Physical Therapy Treatment Patient Details Name: Kaylee Hull MRN: [id number] DOB: Mar 18, 1946 Today's Date: 11/16/2016    History of Present Illness Pt s/p R THR    PT Comments    Pt continues motivated but ltd this pm by onset nausea and "I feel like I'm going to pass out".  Pt assisted to recliner, transported to room with RN assessing.  Follow Up Recommendations  Home health PT     Equipment Recommendations  Rolling walker with 5" wheels    Recommendations for Other Services OT consult     Precautions / Restrictions Precautions Precautions: Fall Restrictions Weight Bearing Restrictions: No Other Position/Activity Restrictions: WBAT    Mobility  Bed Mobility               General bed mobility comments: NT  Transfers Overall transfer level: Needs assistance Equipment used: Rolling walker (2 wheeled) Transfers: Sit to/from Stand Sit to Stand: Min assist Stand pivot transfers: Mod assist       General transfer comment: cues for LE management and use of UEs to self assist; Stnd/pvt with increased assist from chair in hall to recliner for transport to room  Ambulation/Gait Ambulation/Gait assistance: Min assist;Min guard Ambulation Distance (Feet): 123 Feet Assistive device: Rolling walker (2 wheeled) Gait Pattern/deviations: Step-to pattern;Step-through pattern;Decreased step length - right;Decreased step length - left;Shuffle;Trunk flexed Gait velocity: decr Gait velocity interpretation: Below normal speed for age/gender General Gait Details: cues for sequence, posture and position from RW   Stairs Stairs: Yes   Stair Management: No rails;Step to pattern;Forwards;With walker Number of Stairs: 2 General stair comments: single step twice with RW and cues for sequence and foot/RW placement  Wheelchair Mobility    Modified Rankin (Stroke Patients Only)       Balance Overall balance assessment: No apparent balance deficits (not formally  assessed)                                  Cognition Arousal/Alertness: Awake/alert Behavior During Therapy: WFL for tasks assessed/performed Overall Cognitive Status: Within Functional Limits for tasks assessed                      Exercises      General Comments        Pertinent Vitals/Pain Pain Assessment: 0-10 Pain Score: 3  Pain Location: R hip Pain Descriptors / Indicators: Aching;Sore;Tightness Pain Intervention(s): Limited activity within patient's tolerance;Premedicated before session;Monitored during session    Home Living                      Prior Function            PT Goals (current goals can now be found in the care plan section) Acute Rehab PT Goals Patient Stated Goal: Regain IND and walk without pain PT Goal Formulation: With patient Time For Goal Achievement: 11/18/16 Potential to Achieve Goals: Good Progress towards PT goals: Progressing toward goals    Frequency    7X/week      PT Plan Current plan remains appropriate    Co-evaluation             End of Session Equipment Utilized During Treatment: Gait belt Activity Tolerance: Other (comment);Patient limited by fatigue (nausea) Patient left: in chair;with call bell/phone within reach;with nursing/sitter in room     Time: 1413-1436 PT Time Calculation (min) (ACUTE ONLY): 23 min  Charges:  $  Gait Training: 8-22 mins $Therapeutic Activity: 8-22 mins                    G Codes:      Jarmar Rousseau 11/16/2016, 5:18 PM

## 2016-11-16 NOTE — Discharge Summary (Addendum)
Physician Discharge Summary   Patient ID: Kaylee Hull MRN: [id number] DOB/AGE: 12/02/1945 71 y.o.  Admit date: 11/15/2016 Discharge date: 11-21-2016  Primary Diagnosis:  Osteoarthritis of the Right hip.   Admission Diagnoses:  Past Medical History:  Diagnosis Date  . Abnormal EKG   . Anemia   . Anxiety   . Arthritis   . Depression   . Heart murmur    mentions possible asymptomatic heart murumur years ago  . Hyperlipidemia   . Hypothyroidism   . Insomnia 04/27/2014  . Osteopenia   . Snoring 04/27/2014   Discharge Diagnoses:   Principal Problem:   OA (osteoarthritis) of hip  Estimated body mass index is 22.47 kg/m as calculated from the following:   Height as of this encounter: 5' 5"  (1.651 m).   Weight as of this encounter: 61.2 kg (135 lb).  Procedure(s) (LRB): RIGHT TOTAL HIP ARTHROPLASTY ANTERIOR APPROACH (Right)   Consults: Medical Consult - [facility]  HPI: Kaylee Hull is a 71 y.o. female who has advanced end-  stage arthritis of their Right  hip with progressively worsening pain and  dysfunction.The patient has failed nonoperative management and presents for  total hip arthroplasty.   Laboratory Data: Admission on 11/15/2016  Component Date Value Ref Range Status  . WBC 11/16/2016 6.0  4.0 - 10.5 K/uL Final  . RBC 11/16/2016 3.05* 3.87 - 5.11 MIL/uL Final  . Hemoglobin 11/16/2016 9.5* 12.0 - 15.0 g/dL Final  . HCT 11/16/2016 27.5* 36.0 - 46.0 % Final  . MCV 11/16/2016 90.2  78.0 - 100.0 fL Final  . MCH 11/16/2016 31.1  26.0 - 34.0 pg Final  . MCHC 11/16/2016 34.5  30.0 - 36.0 g/dL Final  . RDW 11/16/2016 13.2  11.5 - 15.5 % Final  . Platelets 11/16/2016 181  150 - 400 K/uL Final  . Sodium 11/16/2016 134* 135 - 145 mmol/L Final  . Potassium 11/16/2016 3.8  3.5 - 5.1 mmol/L Final  . Chloride 11/16/2016 103  101 - 111 mmol/L Final  . CO2 11/16/2016 26  22 - 32 mmol/L Final  . Glucose, Bld 11/16/2016 117* 65 - 99 mg/dL Final  . BUN  11/16/2016 7  6 - 20 mg/dL Final  . Creatinine, Ser 11/16/2016 0.68  0.44 - 1.00 mg/dL Final  . Calcium 11/16/2016 8.1* 8.9 - 10.3 mg/dL Final  . GFR calc non Af Amer 11/16/2016 >60  >60 mL/min Final  . GFR calc Af Amer 11/16/2016 >60  >60 mL/min Final   Comment: (NOTE) The eGFR has been calculated using the CKD EPI equation. This calculation has not been validated in all clinical situations. eGFR's persistently <60 mL/min signify possible Chronic Kidney Disease.   . Anion gap 11/16/2016 5  5 - 15 Final  . WBC 11/17/2016 5.6  4.0 - 10.5 K/uL Final  . RBC 11/17/2016 2.99* 3.87 - 5.11 MIL/uL Final  . Hemoglobin 11/17/2016 9.1* 12.0 - 15.0 g/dL Final  . HCT 11/17/2016 26.5* 36.0 - 46.0 % Final  . MCV 11/17/2016 88.6  78.0 - 100.0 fL Final  . MCH 11/17/2016 30.4  26.0 - 34.0 pg Final  . MCHC 11/17/2016 34.3  30.0 - 36.0 g/dL Final  . RDW 11/17/2016 13.0  11.5 - 15.5 % Final  . Platelets 11/17/2016 178  150 - 400 K/uL Final  . Sodium 11/17/2016 143  135 - 145 mmol/L Final  . Potassium 11/17/2016 3.4* 3.5 - 5.1 mmol/L Final  . Chloride 11/17/2016 108  101 - 111  mmol/L Final  . CO2 11/17/2016 29  22 - 32 mmol/L Final  . Glucose, Bld 11/17/2016 106* 65 - 99 mg/dL Final  . BUN 11/17/2016 11  6 - 20 mg/dL Final  . Creatinine, Ser 11/17/2016 0.66  0.44 - 1.00 mg/dL Final  . Calcium 11/17/2016 8.8* 8.9 - 10.3 mg/dL Final  . GFR calc non Af Amer 11/17/2016 >60  >60 mL/min Final  . GFR calc Af Amer 11/17/2016 >60  >60 mL/min Final   Comment: (NOTE) The eGFR has been calculated using the CKD EPI equation. This calculation has not been validated in all clinical situations. eGFR's persistently <60 mL/min signify possible Chronic Kidney Disease.   . Anion gap 11/17/2016 6  5 - 15 Final  . WBC 11/18/2016 4.6  4.0 - 10.5 K/uL Final  . RBC 11/18/2016 2.81* 3.87 - 5.11 MIL/uL Final  . Hemoglobin 11/18/2016 8.7* 12.0 - 15.0 g/dL Final  . HCT 11/18/2016 25.4* 36.0 - 46.0 % Final  . MCV 11/18/2016  90.4  78.0 - 100.0 fL Final  . MCH 11/18/2016 31.0  26.0 - 34.0 pg Final  . MCHC 11/18/2016 34.3  30.0 - 36.0 g/dL Final  . RDW 11/18/2016 13.1  11.5 - 15.5 % Final  . Platelets 11/18/2016 179  150 - 400 K/uL Final  . WBC 11/20/2016 4.2  4.0 - 10.5 K/uL Final  . RBC 11/20/2016 3.10* 3.87 - 5.11 MIL/uL Final  . Hemoglobin 11/20/2016 9.3* 12.0 - 15.0 g/dL Final  . HCT 11/20/2016 27.5* 36.0 - 46.0 % Final  . MCV 11/20/2016 88.7  78.0 - 100.0 fL Final  . MCH 11/20/2016 30.0  26.0 - 34.0 pg Final  . MCHC 11/20/2016 33.8  30.0 - 36.0 g/dL Final  . RDW 11/20/2016 12.9  11.5 - 15.5 % Final  . Platelets 11/20/2016 214  150 - 400 K/uL Final  . Sodium 11/20/2016 139  135 - 145 mmol/L Final  . Potassium 11/20/2016 3.8  3.5 - 5.1 mmol/L Final  . Chloride 11/20/2016 103  101 - 111 mmol/L Final  . CO2 11/20/2016 30  22 - 32 mmol/L Final  . Glucose, Bld 11/20/2016 115* 65 - 99 mg/dL Final  . BUN 11/20/2016 13  6 - 20 mg/dL Final  . Creatinine, Ser 11/20/2016 0.63  0.44 - 1.00 mg/dL Final  . Calcium 11/20/2016 8.7* 8.9 - 10.3 mg/dL Final  . GFR calc non Af Amer 11/20/2016 >60  >60 mL/min Final  . GFR calc Af Amer 11/20/2016 >60  >60 mL/min Final   Comment: (NOTE) The eGFR has been calculated using the CKD EPI equation. This calculation has not been validated in all clinical situations. eGFR's persistently <60 mL/min signify possible Chronic Kidney Disease.   . Anion gap 11/20/2016 6  5 - 15 Final  . Color, Urine 11/20/2016 YELLOW  YELLOW Final  . APPearance 11/20/2016 CLEAR  CLEAR Final  . Specific Gravity, Urine 11/20/2016 1.012  1.005 - 1.030 Final  . pH 11/20/2016 7.0  5.0 - 8.0 Final  . Glucose, UA 11/20/2016 NEGATIVE  NEGATIVE mg/dL Final  . Hgb urine dipstick 11/20/2016 SMALL* NEGATIVE Final  . Bilirubin Urine 11/20/2016 NEGATIVE  NEGATIVE Final  . Ketones, ur 11/20/2016 NEGATIVE  NEGATIVE mg/dL Final  . Protein, ur 11/20/2016 NEGATIVE  NEGATIVE mg/dL Final  . Nitrite 11/20/2016 NEGATIVE   NEGATIVE Final  . Leukocytes, UA 11/20/2016 NEGATIVE  NEGATIVE Final  . RBC / HPF 11/20/2016 0-5  0 - 5 RBC/hpf Final  . WBC, UA  11/20/2016 0-5  0 - 5 WBC/hpf Final  . Bacteria, UA 11/20/2016 NONE SEEN  NONE SEEN Final  . Squamous Epithelial / LPF 11/20/2016 0-5* NONE SEEN Final  . Mucous 11/20/2016 PRESENT   Final  . TSH 11/20/2016 5.413* 0.350 - 4.500 uIU/mL Final  . Cortisol, Plasma 11/20/2016 9.0  ug/dL Final   Comment: (NOTE) AM    6.7 - 22.6 ug/dL PM   <10.0       ug/dL Performed at [facility] Outpatient Visit on 11/09/2016  Component Date Value Ref Range Status  . aPTT 11/09/2016 46* 24 - 36 seconds Final   Comment:        IF BASELINE aPTT IS ELEVATED, SUGGEST PATIENT RISK ASSESSMENT BE USED TO DETERMINE APPROPRIATE ANTICOAGULANT THERAPY.   . WBC 11/09/2016 3.2* 4.0 - 10.5 K/uL Final  . RBC 11/09/2016 3.60* 3.87 - 5.11 MIL/uL Final  . Hemoglobin 11/09/2016 10.8* 12.0 - 15.0 g/dL Final  . HCT 11/09/2016 31.8* 36.0 - 46.0 % Final  . MCV 11/09/2016 88.3  78.0 - 100.0 fL Final  . MCH 11/09/2016 30.0  26.0 - 34.0 pg Final  . MCHC 11/09/2016 34.0  30.0 - 36.0 g/dL Final  . RDW 11/09/2016 12.9  11.5 - 15.5 % Final  . Platelets 11/09/2016 204  150 - 400 K/uL Final  . Sodium 11/09/2016 136  135 - 145 mmol/L Final  . Potassium 11/09/2016 3.8  3.5 - 5.1 mmol/L Final  . Chloride 11/09/2016 103  101 - 111 mmol/L Final  . CO2 11/09/2016 27  22 - 32 mmol/L Final  . Glucose, Bld 11/09/2016 101* 65 - 99 mg/dL Final  . BUN 11/09/2016 11  6 - 20 mg/dL Final  . Creatinine, Ser 11/09/2016 0.73  0.44 - 1.00 mg/dL Final  . Calcium 11/09/2016 8.8* 8.9 - 10.3 mg/dL Final  . Total Protein 11/09/2016 6.8  6.5 - 8.1 g/dL Final  . Albumin 11/09/2016 4.5  3.5 - 5.0 g/dL Final  . AST 11/09/2016 19  15 - 41 U/L Final  . ALT 11/09/2016 11* 14 - 54 U/L Final  . Alkaline Phosphatase 11/09/2016 46  38 - 126 U/L Final  . Total Bilirubin 11/09/2016 0.6  0.3 - 1.2 mg/dL Final  .  GFR calc non Af Amer 11/09/2016 >60  >60 mL/min Final  . GFR calc Af Amer 11/09/2016 >60  >60 mL/min Final   Comment: (NOTE) The eGFR has been calculated using the CKD EPI equation. This calculation has not been validated in all clinical situations. eGFR's persistently <60 mL/min signify possible Chronic Kidney Disease.   . Anion gap 11/09/2016 6  5 - 15 Final  . Prothrombin Time 11/09/2016 12.5  11.4 - 15.2 seconds Final  . INR 11/09/2016 0.93   Final  . ABO/RH(D) 11/15/2016 A POS   Final  . Antibody Screen 11/15/2016 NEG   Final  . Sample Expiration 11/15/2016 11/18/2016   Final  . Extend sample reason 11/15/2016 NO TRANSFUSIONS OR PREGNANCY IN THE PAST 3 MONTHS   Final  . MRSA, PCR 11/09/2016 NEGATIVE  NEGATIVE Final  . Staphylococcus aureus 11/09/2016 NEGATIVE  NEGATIVE Final   Comment:        The Xpert SA Assay (FDA approved for NASAL specimens in patients over 62 years of age), is one component of a comprehensive surveillance program.  Test performance has been validated by [facility] for patients greater than or equal to 64 year old. It is not intended to diagnose  infection nor to guide or monitor treatment.   . ABO/RH(D) 11/09/2016 A POS   Final     X-Rays:Ct Head Wo Contrast  Result Date: 11/20/2016 CLINICAL DATA:  Hip replacement 5 days ago with recent syncopal episode and dizziness. EXAM: CT HEAD WITHOUT CONTRAST TECHNIQUE: Contiguous axial images were obtained from the base of the skull through the vertex without intravenous contrast. COMPARISON:  None. FINDINGS: Brain: Ventricles, cisterns and other CSF spaces are within normal. There is no mass, mass effect, shift of midline structures or acute hemorrhage. There is no evidence of acute infarction. Vascular: Minimal calcified plaque over the cavernous segment of the internal carotid arteries. Skull: Within normal. Sinuses/Orbits: Within normal. Other: None. IMPRESSION: No acute intracranial findings. Electronically  Signed   By: Marin Olp M.D.   On: 11/20/2016 15:56   Dg Pelvis Portable  Result Date: 11/15/2016 CLINICAL DATA:  Surgery EXAM: PORTABLE PELVIS 1-2 VIEWS COMPARISON:  Portable exam 1239 hours without priors for comparison ; correlation CT abdomen pelvis 10/26/2015 FINDINGS: Diffuse osseous demineralization. RIGHT hip prosthesis without fracture or dislocation. Surgical drain overlies the surgical site, lateral to the greater trochanter. Scattered soft tissue gas related to surgery. Visualized pelvis intact. IMPRESSION: RIGHT hip prosthesis and osseous demineralization without acute bony abnormalities. Electronically Signed   By: Lavonia Dana M.D.   On: 11/15/2016 13:05   Dg C-arm 1-60 Min-no Report  Result Date: 11/15/2016 There is no Radiologist interpretation  for this exam.   EKG: Orders placed or performed during the hospital encounter of 11/15/16  . EKG 12-Lead  . EKG 12-Lead  . EKG 12-Lead  . EKG 12-Lead  . EKG 12-Lead  . EKG 12-Lead     Hospital Course: Patient was admitted to [facility] and taken to the OR and underwent the above state procedure without complications.  Patient tolerated the procedure well and was later transferred to the recovery room and then to the orthopaedic floor for postoperative care.  They were given PO and IV analgesics for pain control following their surgery.  They were given 24 hours of postoperative antibiotics of  Anti-infectives    Start     Dose/Rate Route Frequency Ordered Stop   11/15/16 1630  ceFAZolin (ANCEF) IVPB 2g/100 mL premix     2 g 200 mL/hr over 30 Minutes Intravenous Every 6 hours 11/15/16 1621 11/15/16 2352   11/15/16 0802  ceFAZolin (ANCEF) IVPB 2g/100 mL premix     2 g 200 mL/hr over 30 Minutes Intravenous On call to O.R. 11/15/16 0802 11/15/16 1039     and started on DVT prophylaxis in the form of Xarelto.   PT and OT were ordered for total hip protocol.  The patient was allowed to be WBAT with therapy. Discharge  planning was consulted to help with postop disposition and equipment needs.   POD 1 - Patient had a decent night on the evening of surgery.  They started to get up OOB with therapy on day one.  Hemovac drain was pulled without difficulty. Dressing was checked on day one was clean and dry.  Patient was seen in rounds on day one by Dr. Wynelle Link and was ready to go home. Unfortunately, the patient experienced some nausea and dizziness that afternoon with therapy and unable to go home.  She was kept and monitored until POD 2.   POD 2 - She was seen in rounds by Dr. Wynelle Link and still had some dizziness. I&O's showed that the patient was negative on  fluids.  Gave fluids that morning and rechecked pressures.  Patient still wanted to go home so she was setup for later that day if improved. POD 3 - She was unable to go home due to still experiencing symptoms and not safe for discharge.  Patient reported pain as mild to right hip.  Tolerating PO's. Reported dizziness with ambulating. Nausea with any type of ambulation. POD 4 -  Patient reported pain as mild, pain controlled. No events throughout the previous night. Still reported dizziness while sitting in the bed and with ambulating.  She stated that she is also having significant nausea, but has not vomited.  Patient has not much in the way of analgesic medications. POD 5 - Progressing slowly with several days c/o "dizziness", and feeling "queezie" especially with therapy. Had a vagal episode with therapy that morning. Given fluids.  Medical consult called due to continued presyncopal symptoms.  Impression/Recommendations 1-OA (osteoarthritis) of hip: s/p right THR -further management per orthopedic service -recovering and healing well -pain controlled, and only requiring tylenol -continue PT/OT -xarelto for DVT prophylaxis  -weight bearing as tolerated 2-lightheadedness and dizzines   -appears to be secondary to orthostasis -patient most likely dry prior  to surgery and subsequently behind in volume shifting/adjustment. -CT head unremarkable, r/o any component of posterior circulation problem -TSH close to normal; continue synthroid -slight anemia, no transfusion needed at this point. Started on niferex daily -cortisol level WNL -symptoms improved/resolved with IVF's boluses. Will recommend another continue IVF's and increased to 100 cc/her for another 12 hours. -patient advised to maintain adequate fluid intake and to increase slightly more sodium intake. -will benefit of stocking socks while up -will follow orthostatic VS in am -avoid use of ambien and minimize use of robaxin -no narcotics if possible -no signs of infection in her UA -if symptoms persisted might try PRN meclizine. 3-hypothyroidism -continue synthroid -repeat thyroid function panel in 4 weeks 4-HLD -continue zocor 5-depression: -continue zoloft 6-osteoporosis -resume fosamax and drisdol as an outpatient  Thank you for this consultation.  Our [facility] hospitalist team will follow the patient with you. Barton Dubois M.D. POD 6 - Seen again on POD 6 and was feeling better.   Pt feeling much better and physically appeared better. Assisted with ambulation a greater distance in hallway with NO c/o dizziness/queezie.  Felt had turned the corner and was ready to go home at that time.  Discharge home with home health Diet - Cardiac diet Follow up - in 2 weeks Activity - WBAT Disposition - Home Condition Upon Discharge - Pending, Home if improved. D/C Meds - See DC Summary DVT Prophylaxis - Xarelto  Discharge Instructions    Call MD / Call 911    Complete by:  As directed    If you experience chest pain or shortness of breath, CALL 911 and be transported to the hospital emergency room.  If you develope a fever above 101 F, pus (white drainage) or increased drainage or redness at the wound, or calf pain, call your surgeon's office.   Change dressing    Complete by:  As  directed    You may change your dressing dressing daily with sterile 4 x 4 inch gauze dressing and paper tape.  Do not submerge the incision under water.   Constipation Prevention    Complete by:  As directed    Drink plenty of fluids.  Prune juice may be helpful.  You may use a stool softener, such as Colace (over the counter)  100 mg twice a day.  Use MiraLax (over the counter) for constipation as needed.   Diet - low sodium heart healthy    Complete by:  As directed    Discharge instructions    Complete by:  As directed    Pick up stool softner and laxative for home use following surgery while on pain medications. Do not submerge incision under water. Please use good hand washing techniques while changing dressing each day. May shower starting three days after surgery. Please use a clean towel to pat the incision dry following showers. Continue to use ice for pain and swelling after surgery. Do not use any lotions or creams on the incision until instructed by your surgeon.  Wear both TED hose on both legs during the day every day for three weeks, but may have off at night at home.  Postoperative Constipation Protocol  Constipation - defined medically as fewer than three stools per week and severe constipation as less than one stool per week.  One of the most common issues patients have following surgery is constipation.  Even if you have a regular bowel pattern at home, your normal regimen is likely to be disrupted due to multiple reasons following surgery.  Combination of anesthesia, postoperative narcotics, change in appetite and fluid intake all can affect your bowels.  In order to avoid complications following surgery, here are some recommendations in order to help you during your recovery period.  Colace (docusate) - Pick up an over-the-counter form of Colace or another stool softener and take twice a day as long as you are requiring postoperative pain medications.  Take with a full  glass of water daily.  If you experience loose stools or diarrhea, hold the colace until you stool forms back up.  If your symptoms do not get better within 1 week or if they get worse, check with your doctor.  Dulcolax (bisacodyl) - Pick up over-the-counter and take as directed by the product packaging as needed to assist with the movement of your bowels.  Take with a full glass of water.  Use this product as needed if not relieved by Colace only.   MiraLax (polyethylene glycol) - Pick up over-the-counter to have on hand.  MiraLax is a solution that will increase the amount of water in your bowels to assist with bowel movements.  Take as directed and can mix with a glass of water, juice, soda, coffee, or tea.  Take if you go more than two days without a movement. Do not use MiraLax more than once per day. Call your doctor if you are still constipated or irregular after using this medication for 7 days in a row.  If you continue to have problems with postoperative constipation, please contact the office for further assistance and recommendations.  If you experience "the worst abdominal pain ever" or develop nausea or vomiting, please contact the office immediatly for further recommendations for treatment.   Take Xarelto for two and a half more weeks, then discontinue Xarelto. Once the patient has completed the blood thinner regimen, then take a Baby 81 mg Aspirin daily for three more weeks.    Do not sit on low chairs, stoools or toilet seats, as it may be difficult to get up from low surfaces    Complete by:  As directed    Driving restrictions    Complete by:  As directed    No driving until released by the physician.   Increase activity slowly as  tolerated    Complete by:  As directed    Lifting restrictions    Complete by:  As directed    No lifting until released by the physician.   Patient may shower    Complete by:  As directed    You may shower without a dressing once there is no  drainage.  Do not wash over the wound.  If drainage remains, do not shower until drainage stops.   TED hose    Complete by:  As directed    Use stockings (TED hose) for 3 weeks on both leg(s).  You may remove them at night for sleeping.   Weight bearing as tolerated    Complete by:  As directed    Laterality:  right   Extremity:  Lower     Allergies as of 11/17/2016      Reactions   Other    Bee stings-swelling/discomfort   Sulfa Antibiotics Hives    Med Rec must be completed prior to using this [facility]      Durable Medical Equipment        Start     Ordered   11/16/16 1247  For home use only DME Walker rolling  Once    Question:  Patient needs a walker to treat with the following condition  Answer:  OA (osteoarthritis) of hip   11/16/16 1246     Follow-up Information    Gearlean Alf, MD. Schedule an appointment as soon as possible for a visit on 11/28/2016.   Specialty:  Orthopedic Surgery Contact information: [address] [place] [address] [phone]        [facility] Follow up.   Specialty:  Home Health Services Why:  home health physical therapy Contact information: [address] [place] [address] [phone]        Inc[facility] - [facility] Follow up.   Why:  rolling walker Contact information: [place] [address] [phone]           Signed: Arlee Muslim, PA-C Orthopaedic Surgery 11/21/2016, 9:02 AM

## 2016-11-16 NOTE — Discharge Instructions (Addendum)
Dr. Ollen Gross Total Joint Specialist [facility] [address] [place], [place] [address] [phone]  Information on my medicine - XARELTO (Rivaroxaban)  This medication education was reviewed with me or my healthcare representative as part of my discharge preparation.  The pharmacist that spoke with me during my hospital stay was:  Park Pope, PharmD Candidate  Why was Xarelto prescribed for you? Xarelto was prescribed for you to reduce the risk of blood clots forming after orthopedic surgery. The medical term for these abnormal blood clots is venous thromboembolism (VTE).  What do you need to know about xarelto ? Take your Xarelto ONCE DAILY at the same time every day. You may take it either with or without food.  If you have difficulty swallowing the tablet whole, you may crush it and mix in applesauce just prior to taking your dose.  Take Xarelto exactly as prescribed by your doctor and DO NOT stop taking Xarelto without talking to the doctor who prescribed the medication.  Stopping without other VTE prevention medication to take the place of Xarelto may increase your risk of developing a clot.  After discharge, you should have regular check-up appointments with your healthcare provider that is prescribing your Xarelto.    What do you do if you miss a dose? If you miss a dose, take it as soon as you remember on the same day then continue your regularly scheduled once daily regimen the next day. Do not take two doses of Xarelto on the same day.   Important Safety Information A possible side effect of Xarelto is bleeding. You should call your healthcare provider right away if you experience any of the following: ? Bleeding from an injury or your nose that does not stop. ? Unusual colored urine (red or dark brown) or unusual colored stools (red or black). ? Unusual bruising for unknown reasons. ? A serious fall or if you hit your head  (even if there is no bleeding).  Some medicines may interact with Xarelto and might increase your risk of bleeding while on Xarelto. To help avoid this, consult your healthcare provider or pharmacist prior to using any new prescription or non-prescription medications, including herbals, vitamins, non-steroidal anti-inflammatory drugs (NSAIDs) and supplements.  This website has more information on Xarelto: VisitDestination.com.br.     ANTERIOR APPROACH TOTAL HIP REPLACEMENT POSTOPERATIVE DIRECTIONS   Hip Rehabilitation, Guidelines Following Surgery  The results of a hip operation are greatly improved after range of motion and muscle strengthening exercises. Follow all safety measures which are given to protect your hip. If any of these exercises cause increased pain or swelling in your joint, decrease the amount until you are comfortable again. Then slowly increase the exercises. Call your caregiver if you have problems or questions.   HOME CARE INSTRUCTIONS  Remove items at home which could result in a fall. This includes throw rugs or furniture in walking pathways.   ICE to the affected hip every three hours for 30 minutes at a time and then as needed for pain and swelling.  Continue to use ice on the hip for pain and swelling from surgery. You may notice swelling that will progress down to the foot and ankle.  This is normal after surgery.  Elevate the leg when you are not up walking on it.    Continue to use the breathing machine which will help keep your temperature down.  It is common for your temperature to cycle up and down following  surgery, especially at night when you are not up moving around and exerting yourself.  The breathing machine keeps your lungs expanded and your temperature down.   DIET You may resume your previous home diet once your are discharged from the hospital.  DRESSING / WOUND CARE / SHOWERING You may shower 3 days after surgery, but keep the wounds dry during  showering.  You may use an occlusive plastic wrap (Press'n Seal for example), NO SOAKING/SUBMERGING IN THE BATHTUB.  If the bandage gets wet, change with a clean dry gauze.  If the incision gets wet, pat the wound dry with a clean towel. You may start showering once you are discharged home but do not submerge the incision under water. Just pat the incision dry and apply a dry gauze dressing on daily. Change the surgical dressing daily and reapply a dry dressing each time.  ACTIVITY Walk with your walker as instructed. Use walker as long as suggested by your caregivers. Avoid periods of inactivity such as sitting longer than an hour when not asleep. This helps prevent blood clots.  You may resume a sexual relationship in one month or when given the OK by your doctor.  You may return to work once you are cleared by your doctor.  Do not drive a car for 6 weeks or until released by you surgeon.  Do not drive while taking narcotics.  WEIGHT BEARING Weight bearing as tolerated with assist device (walker, cane, etc) as directed, use it as long as suggested by your surgeon or therapist, typically at least 4-6 weeks.  POSTOPERATIVE CONSTIPATION PROTOCOL Constipation - defined medically as fewer than three stools per week and severe constipation as less than one stool per week.  One of the most common issues patients have following surgery is constipation.  Even if you have a regular bowel pattern at home, your normal regimen is likely to be disrupted due to multiple reasons following surgery.  Combination of anesthesia, postoperative narcotics, change in appetite and fluid intake all can affect your bowels.  In order to avoid complications following surgery, here are some recommendations in order to help you during your recovery period.  Colace (docusate) - Pick up an over-the-counter form of Colace or another stool softener and take twice a day as long as you are requiring postoperative pain  medications.  Take with a full glass of water daily.  If you experience loose stools or diarrhea, hold the colace until you stool forms back up.  If your symptoms do not get better within 1 week or if they get worse, check with your doctor.  Dulcolax (bisacodyl) - Pick up over-the-counter and take as directed by the product packaging as needed to assist with the movement of your bowels.  Take with a full glass of water.  Use this product as needed if not relieved by Colace only.   MiraLax (polyethylene glycol) - Pick up over-the-counter to have on hand.  MiraLax is a solution that will increase the amount of water in your bowels to assist with bowel movements.  Take as directed and can mix with a glass of water, juice, soda, coffee, or tea.  Take if you go more than two days without a movement. Do not use MiraLax more than once per day. Call your doctor if you are still constipated or irregular after using this medication for 7 days in a row.  If you continue to have problems with postoperative constipation, please contact the office for further assistance  and recommendations.  If you experience "the worst abdominal pain ever" or develop nausea or vomiting, please contact the office immediatly for further recommendations for treatment.  ITCHING  If you experience itching with your medications, try taking only a single pain pill, or even half a pain pill at a time.  You can also use Benadryl over the counter for itching or also to help with sleep.   TED HOSE STOCKINGS Wear the elastic stockings on both legs for three weeks following surgery during the day but you may remove then at night for sleeping.  MEDICATIONS See your medication summary on the After Visit Summary that the nursing staff will review with you prior to discharge.  You may have some home medications which will be placed on hold until you complete the course of blood thinner medication.  It is important for you to complete the blood  thinner medication as prescribed by your surgeon.  Continue your approved medications as instructed at time of discharge.  PRECAUTIONS If you experience chest pain or shortness of breath - call 911 immediately for transfer to the hospital emergency department.  If you develop a fever greater that 101 F, purulent drainage from wound, increased redness or drainage from wound, foul odor from the wound/dressing, or calf pain - CONTACT YOUR SURGEON.                                                   FOLLOW-UP APPOINTMENTS Make sure you keep all of your appointments after your operation with your surgeon and caregivers. You should call the office at the above phone number and make an appointment for approximately two weeks after the date of your surgery or on the date instructed by your surgeon outlined in the "After Visit Summary".  RANGE OF MOTION AND STRENGTHENING EXERCISES  These exercises are designed to help you keep full movement of your hip joint. Follow your caregiver's or physical therapist's instructions. Perform all exercises about fifteen times, three times per day or as directed. Exercise both hips, even if you have had only one joint replacement. These exercises can be done on a training (exercise) mat, on the floor, on a table or on a bed. Use whatever works the best and is most comfortable for you. Use music or television while you are exercising so that the exercises are a pleasant break in your day. This will make your life better with the exercises acting as a break in routine you can look forward to.  Lying on your back, slowly slide your foot toward your buttocks, raising your knee up off the floor. Then slowly slide your foot back down until your leg is straight again.  Lying on your back spread your legs as far apart as you can without causing discomfort.  Lying on your side, raise your upper leg and foot straight up from the floor as far as is comfortable. Slowly lower the leg and  repeat.  Lying on your back, tighten up the muscle in the front of your thigh (quadriceps muscles). You can do this by keeping your leg straight and trying to raise your heel off the floor. This helps strengthen the largest muscle supporting your knee.  Lying on your back, tighten up the muscles of your buttocks both with the legs straight and with the knee bent at a  comfortable angle while keeping your heel on the floor.   IF YOU ARE TRANSFERRED TO A SKILLED REHAB FACILITY If the patient is transferred to a skilled rehab facility following release from the hospital, a list of the current medications will be sent to the facility for the patient to continue.  When discharged from the skilled rehab facility, please have the facility set up the patient's Home Health Physical Therapy prior to being released. Also, the skilled facility will be responsible for providing the patient with their medications at time of release from the facility to include their pain medication, the muscle relaxants, and their blood thinner medication. If the patient is still at the rehab facility at time of the two week follow up appointment, the skilled rehab facility will also need to assist the patient in arranging follow up appointment in our office and any transportation needs.  MAKE SURE YOU:  Understand these instructions.  Get help right away if you are not doing well or get worse.    Pick up stool softner and laxative for home use following surgery while on pain medications. Do not submerge incision under water. Please use good hand washing techniques while changing dressing each day. May shower starting three days after surgery. Please use a clean towel to pat the incision dry following showers. Continue to use ice for pain and swelling after surgery. Do not use any lotions or creams on the incision until instructed by your surgeon.  Take Xarelto for two and a half more weeks, then discontinue Xarelto. Once the  patient has completed the blood thinner regimen, then take a Baby 81 mg Aspirin daily for three more weeks.

## 2016-11-16 NOTE — Progress Notes (Signed)
1425 patient up with therapy on stairs. Started to feel faint and nauseated. Put in chair back to room pale and b/p 102/55 sat 99 hr 51. D Perkins paged with orders received. D FranklinRN

## 2016-11-16 NOTE — Progress Notes (Signed)
   Subjective: 1 Day Post-Op Procedure(s) (LRB): RIGHT TOTAL HIP ARTHROPLASTY ANTERIOR APPROACH (Right) Patient reports pain as mild.   Patient seen in rounds by Dr. Lequita HaltAluisio. Patient is well, but has had some minor complaints of pain in the hip, requiring pain medications We will start therapy today.  If they do well with therapy and meets all goals, then will allow home later this afternoon following therapy. Plan is to go Home after hospital stay.  Objective: Vital signs in last 24 hours: Temp:  [97.5 F (36.4 C)-98 F (36.7 C)] 98 F (36.7 C) (01/11 0500) Pulse Rate:  [59-79] 59 (01/11 0500) Resp:  [14-24] 16 (01/11 0500) BP: (107-139)/(54-83) 130/73 (01/11 0500) SpO2:  [95 %-100 %] 97 % (01/11 0500)  Intake/Output from previous day:  Intake/Output Summary (Last 24 hours) at 11/16/16 0917 Last data filed at 11/16/16 0640  Gross per 24 hour  Intake             4789 ml  Output             6007 ml  Net            -1218 ml    Intake/Output this shift: No intake/output data recorded.  Labs:  Recent Labs  11/16/16 0442  HGB 9.5*    Recent Labs  11/16/16 0442  WBC 6.0  RBC 3.05*  HCT 27.5*  PLT 181    Recent Labs  11/16/16 0442  NA 134*  K 3.8  CL 103  CO2 26  BUN 7  CREATININE 0.68  GLUCOSE 117*  CALCIUM 8.1*   No results for input(s): LABPT, INR in the last 72 hours.  EXAM General - Patient is Alert, Appropriate and Oriented Extremity - Neurovascular intact Sensation intact distally Dorsiflexion/Plantar flexion intact Dressing - dressing C/D/I Motor Function - intact, moving foot and toes well on exam.  Hemovac pulled without difficulty.  Past Medical History:  Diagnosis Date  . Abnormal EKG   . Anemia   . Anxiety   . Arthritis   . Depression   . Heart murmur    mentions possible asymptomatic heart murumur years ago  . Hyperlipidemia   . Hypothyroidism   . Insomnia 04/27/2014  . Osteopenia   . Snoring 04/27/2014     Assessment/Plan: 1 Day Post-Op Procedure(s) (LRB): RIGHT TOTAL HIP ARTHROPLASTY ANTERIOR APPROACH (Right) Principal Problem:   OA (osteoarthritis) of hip  Estimated body mass index is 22.47 kg/m as calculated from the following:   Height as of this encounter: 5\' 5"  (1.651 m).   Weight as of this encounter: 61.2 kg (135 lb). Up with therapy Discharge home with home health  DVT Prophylaxis - Xarelto Weight Bearing As Tolerated right Leg Hemovac Pulled Begin Therapy  If meets goals and able to go home: Discharge home with home health Diet - Cardiac diet Follow up - in 2 weeks Activity - WBAT Disposition - Home Condition Upon Discharge - Good D/C Meds - See DC Summary DVT Prophylaxis - Xarelto  Avel Peacerew Kevin Space, PA-C Orthopaedic Surgery 11/16/2016, 9:17 AM

## 2016-11-16 NOTE — Care Management Note (Signed)
Case Management Note  Patient Details  Name: Kaylee Hull MRN: [id number] Date of Birth: 01-Dec-1945  Subjective/Objective:                  RIGHT TOTAL HIP ARTHROPLASTY ANTERIOR APPROACH (Right) Action/Plan: Discharge planning Expected Discharge Date:  11/17/16               Expected Discharge Plan:  [place]  In-House Referral:     Discharge planning Services  CM Consult  Post Acute Care Choice:  Home Health Choice offered to:  Patient  DME Arranged:  Walker rolling DME Agency:  [place]:  PT [place] Agency:  [facility] ([facility]  Status of Service:  Completed, signed off  If discussed at [employer] of Stay Meetings, dates discussed:    Additional Comments: CM met with pt in room to offer choice of home health agency. Pt chooses [facility] to render HHPT.  Referral given to [facility] rep, Tim.  Cm notified [place] DME rep, Larene Beach to please deliver the rolling walker to room prior to discharge.  No other CM needs were communicated. Dellie Catholic, RN 11/16/2016, 12:47 PM

## 2016-11-16 NOTE — Progress Notes (Signed)
OT Cancellation Note  Patient Details Name: Kaylee Hull MRN: [id number] DOB: 09/18/1946   Cancelled Treatment:    Reason Eval/Treat Not Completed: Medical issues which prohibited therapy;Pain limiting ability to participate.  Attempted x 2.  First attempt, pt with increased pain and requesting to defer until after pain meds;  2nd attempt, pt hypotensive with PT and c/o nausea.  Will try tomorrow.   Ebin Palazzi [place], OTR/L [phone]   Jeani HawkingConarpe, Allizon Woznick M 11/16/2016, 3:19 PM

## 2016-11-17 LAB — BASIC METABOLIC PANEL
Anion gap: 6 (ref 5–15)
BUN: 11 mg/dL (ref 6–20)
CALCIUM: 8.8 mg/dL — AB (ref 8.9–10.3)
CHLORIDE: 108 mmol/L (ref 101–111)
CO2: 29 mmol/L (ref 22–32)
CREATININE: 0.66 mg/dL (ref 0.44–1.00)
GFR calc non Af Amer: >60 mL/min (ref >60)
Glucose, Bld: 106 mg/dL — ABNORMAL HIGH (ref 65–99)
Potassium: 3.4 mmol/L — ABNORMAL LOW (ref 3.5–5.1)
Sodium: 143 mmol/L (ref 135–145)

## 2016-11-17 LAB — CBC
HCT: 26.5 % — ABNORMAL LOW (ref 36.0–46.0)
HEMOGLOBIN: 9.1 g/dL — AB (ref 12.0–15.0)
MCH: 30.4 pg (ref 26.0–34.0)
MCHC: 34.3 g/dL (ref 30.0–36.0)
MCV: 88.6 fL (ref 78.0–100.0)
Platelets: 178 10*3/uL (ref 150–400)
RBC: 2.99 MIL/uL — ABNORMAL LOW (ref 3.87–5.11)
RDW: 13 % (ref 11.5–15.5)
WBC: 5.6 10*3/uL (ref 4.0–10.5)

## 2016-11-17 MED ORDER — HYDROCODONE-ACETAMINOPHEN 5-325 MG PO TABS: 1.0000 | ORAL_TABLET | ORAL | 0 refills | Status: DC | PRN

## 2016-11-17 MED ORDER — SODIUM CHLORIDE 0.9 % IV BOLUS (SEPSIS)
500.0000 mL | Freq: Once | INTRAVENOUS | Status: AC
  Administered 2016-11-17: 08:00:00 500 mL via INTRAVENOUS

## 2016-11-17 MED ORDER — SODIUM CHLORIDE 0.9 % IV BOLUS (SEPSIS)
500.0000 mL | Freq: Once | INTRAVENOUS | Status: AC
  Administered 2016-11-17: 12:00:00 500 mL via INTRAVENOUS

## 2016-11-17 MED ORDER — RIVAROXABAN 10 MG PO TABS: 10.0000 mg | ORAL_TABLET | Freq: Every day | ORAL | 0 refills | Status: DC

## 2016-11-17 MED ORDER — HYDROCODONE-ACETAMINOPHEN 5-325 MG PO TABS: 1.0000 | ORAL_TABLET | ORAL | Status: DC | PRN

## 2016-11-17 MED ORDER — METHOCARBAMOL 500 MG PO TABS: 500.0000 mg | ORAL_TABLET | Freq: Four times a day (QID) | ORAL | 0 refills | Status: DC | PRN

## 2016-11-17 NOTE — Progress Notes (Signed)
Orthostatic VS called to Gareth Eagle Perkins PA with orders received. Sharrell Ku Rajni Holsworth RN

## 2016-11-17 NOTE — Progress Notes (Signed)
   Subjective: 2 Days Post-Op Procedure(s) (LRB): RIGHT TOTAL HIP ARTHROPLASTY ANTERIOR APPROACH (Right) Patient reports pain as mild.   Patient seen in rounds with Dr. Lequita HaltAluisio. Had some lightheadedness again this morning when up to bathroom. Patient is having problems with dizziness.  She experienced this yesterday and again this morning.  I&O's show that the patient is negative on fluids.  Will give fluids this morning and recheck pressures.  Monitor BP's.  Patient still wants to go home so will setup for later today and allow home if improves.  Objective: Vital signs in last 24 hours: Temp:  [97.6 F (36.4 C)-98.7 F (37.1 C)] 98.7 F (37.1 C) (01/12 0536) Pulse Rate:  [60-66] 66 (01/12 0536) Resp:  [14-16] 14 (01/12 0536) BP: (102-130)/(47-78) 109/47 (01/12 0536) SpO2:  [95 %-99 %] 96 % (01/12 0536)  Intake/Output from previous day:  Intake/Output Summary (Last 24 hours) at 11/17/16 0733 Last data filed at 11/17/16 0536  Gross per 24 hour  Intake             1300 ml  Output              600 ml  Net              700 ml    Intake/Output this shift: No intake/output data recorded.  Labs:  Recent Labs  11/16/16 0442 11/17/16 0409  HGB 9.5* 9.1*    Recent Labs  11/16/16 0442 11/17/16 0409  WBC 6.0 5.6  RBC 3.05* 2.99*  HCT 27.5* 26.5*  PLT 181 178    Recent Labs  11/16/16 0442 11/17/16 0409  NA 134* 143  K 3.8 3.4*  CL 103 108  CO2 26 29  BUN 7 11  CREATININE 0.68 0.66  GLUCOSE 117* 106*  CALCIUM 8.1* 8.8*   No results for input(s): LABPT, INR in the last 72 hours.  EXAM: General - Patient is Alert, Appropriate and Oriented Extremity - Neurovascular intact Sensation intact distally Dorsiflexion/Plantar flexion intact Incision - clean, dry, no drainage Motor Function - intact, moving foot and toes well on exam.   Assessment/Plan: 2 Days Post-Op Procedure(s) (LRB): RIGHT TOTAL HIP ARTHROPLASTY ANTERIOR APPROACH (Right) Procedure(s)  (LRB): RIGHT TOTAL HIP ARTHROPLASTY ANTERIOR APPROACH (Right) Past Medical History:  Diagnosis Date  . Abnormal EKG   . Anemia   . Anxiety   . Arthritis   . Depression   . Heart murmur    mentions possible asymptomatic heart murumur years ago  . Hyperlipidemia   . Hypothyroidism   . Insomnia 04/27/2014  . Osteopenia   . Snoring 04/27/2014   Principal Problem:   OA (osteoarthritis) of hip  Estimated body mass index is 22.47 kg/m as calculated from the following:   Height as of this encounter: 5\' 5"  (1.651 m).   Weight as of this encounter: 61.2 kg (135 lb). Up with therapy Discharge home with home health Diet - Cardiac diet Follow up - in 2 weeks Activity - WBAT Disposition - Home Condition Upon Discharge - Pending at this time D/C Meds - See DC Summary DVT Prophylaxis - Xarelto  Avel Peacerew Revan Gendron, PA-C Orthopaedic Surgery 11/17/2016, 7:33 AM

## 2016-11-17 NOTE — Evaluation (Signed)
Occupational Therapy Evaluation Patient Details Name: Kaylee Hull MRN: [id number] DOB: 07-Aug-1946 Today's Date: 11/17/2016    History of Present Illness Pt s/p R THR   Clinical Impression   Pt admitted with above. She demonstrates the below listed deficits and will benefit from continued OT to maximize safety and independence with BADLs.  OT eval limited by hypotenstion>  She will have assist initially upon discharge, but will be alone soon after.   She was instructed in use of AE for LB ADLs as she is unable to access Rt foot.  Will need to practice shower transfers and walker safety with ADLs.   Recommend HHOT at discharge as she has limited support at discharge, and needs to be as independent as possible.       Follow Up Recommendations  HHOT    Equipment Recommendations  None recommended by OT    Recommendations for Other Services       Precautions / Restrictions Precautions Precautions: Fall Restrictions Other Position/Activity Restrictions: WBAT      Mobility Bed Mobility                  Transfers                 General transfer comment: did not attempt due to hypotension - RN started bolus while OT in pt room     Balance                                            ADL                                         General ADL Comments: Pt continues with symptomatic hypotension.  Education completed at chair level.  Pt unable to access feet for LB ADLs.  She was instructed in use of AE, and able to don socks and pull pants over feet with supervision.        Vision     Perception     Praxis      Pertinent Vitals/Pain Pain Assessment: Faces Faces Pain Scale: Hurts little more Pain Location: R hip Pain Descriptors / Indicators: Aching;Sore;Tightness Pain Intervention(s): Limited activity within patient's tolerance;Monitored during session     Hand Dominance Right   Extremity/Trunk Assessment  Upper Extremity Assessment Upper Extremity Assessment: Overall WFL for tasks assessed       Cervical / Trunk Assessment Cervical / Trunk Assessment: Normal   Communication Communication Communication: No difficulties   Cognition Arousal/Alertness: Awake/alert Behavior During Therapy: WFL for tasks assessed/performed Overall Cognitive Status: Within Functional Limits for tasks assessed                     General Comments       Exercises       Shoulder Instructions      Home Living Family/patient expects to be discharged to:: Private residence Living Arrangements: Other relatives;Non-relatives/Friends Available Help at Discharge: Family Type of Home: House Home Access: Stairs to enter Secretary/administratorntrance Stairs-Number of Steps: 1+2 Entrance Stairs-Rails: None Home Layout: One level     Bathroom Shower/Tub: Producer, television/film/videoWalk-in shower   Bathroom Toilet: Handicapped height     Home Equipment: Information systems managerhower seat;Toilet riser   Additional Comments: Pt is able to borrow toilet  riser and tub seat from friends.  She will have friends stay 24hour for first 1-2 days then intermittently       Prior Functioning/Environment Level of Independence: Independent        Comments: Pt is very active and independent         OT Problem List: Decreased strength;Decreased activity tolerance;Impaired balance (sitting and/or standing);Decreased safety awareness;Decreased knowledge of use of DME or AE;Pain   OT Treatment/Interventions: Self-care/ADL training;DME and/or AE instruction;Therapeutic activities;Patient/family education    OT Goals(Current goals can be found in the care plan section) Acute Rehab OT Goals Patient Stated Goal: to go home  OT Goal Formulation: With patient Time For Goal Achievement: 11/24/16 Potential to Achieve Goals: Good ADL Goals Pt Will Perform Grooming: with supervision;standing Pt Will Perform Lower Body Bathing: with supervision;with adaptive equipment;sit to/from  stand Pt Will Perform Lower Body Dressing: with supervision;with adaptive equipment;sit to/from stand Pt Will Transfer to Toilet: with supervision;ambulating;regular height toilet;bedside commode;grab bars Pt Will Perform Toileting - Clothing Manipulation and hygiene: with supervision;sit to/from stand;with adaptive equipment Pt Will Perform Tub/Shower Transfer: Shower transfer;with min guard assist;ambulating;shower seat;rolling walker  OT Frequency: Min 2X/week   Barriers to D/C: Decreased caregiver support          Co-evaluation              End of Session Nurse Communication: Mobility status  Activity Tolerance: Treatment limited secondary to medical complications (Comment) Patient left: in chair;with call bell/phone within reach   Time: 1127-1218 OT Time Calculation (min): 51 min Charges:  OT General Charges $OT Visit: 1 Procedure OT Evaluation $OT Eval Low Complexity: 1 Procedure OT Treatments $Self Care/Home Management : 38-52 mins G-Codes:    Grove Defina M 12-13-2016, 12:28 PM

## 2016-11-17 NOTE — Progress Notes (Signed)
Physical Therapy Treatment Patient Details Name: LINNA THEBEAU MRN: [id number] DOB: 12/16/1945 Today's Date: 11/17/2016    History of Present Illness Pt s/p R THR    PT Comments    POD # 2 pm session Pt still c/o feeling "Rudy Jew" with activity and unable to complete stair training.  Recliner had to be retrieved and pt returned to room.  Max c/o feeling cold.  Oral temp taken was 97.4 Pt not ready for D/C today  Follow Up Recommendations  Home health PT     Equipment Recommendations  Rolling walker with 5" wheels    Recommendations for Other Services       Precautions / Restrictions Precautions Precautions: Fall Restrictions Weight Bearing Restrictions: No Other Position/Activity Restrictions: WBAT    Mobility  Bed Mobility               General bed mobility comments: OOB in recliner  Transfers Overall transfer level: Needs assistance Equipment used: Rolling walker (2 wheeled) Transfers: Sit to/from Stand Sit to Stand: Min assist         General transfer comment: 50% VC's on proper hand placement and increased time to rise  Ambulation/Gait Ambulation/Gait assistance: Min assist Ambulation Distance (Feet): 18 Feet Assistive device: Rolling walker (2 wheeled) Gait Pattern/deviations: Step-to pattern;Step-through pattern;Decreased step length - right;Decreased step length - left;Shuffle;Trunk flexed Gait velocity: decr      Stairs Stairs: Yes   Stair Management: No rails;Step to pattern;Forwards   General stair comments: practiced single step and required 50% VC's on proper tech.  C/O feeling "bad" increased such that recliner had to be retrieved and pt returned to room.   Wheelchair Mobility    Modified Rankin (Stroke Patients Only)       Balance                                    Cognition Arousal/Alertness: Awake/alert Behavior During Therapy: WFL for tasks assessed/performed Overall Cognitive Status: Within  Functional Limits for tasks assessed                      Exercises      General Comments        Pertinent Vitals/Pain Pain Assessment: 0-10 Pain Score: 4  Faces Pain Scale: Hurts little more Pain Location: R hip Pain Descriptors / Indicators: Aching;Sore;Tightness Pain Intervention(s): Monitored during session;Repositioned;Ice applied    Home Living Family/patient expects to be discharged to:: Private residence Living Arrangements: Other relatives;Non-relatives/Friends Available Help at Discharge: Family Type of Home: House Home Access: Stairs to enter Entrance Stairs-Rails: None Home Layout: One level Home Equipment: Information systems manager;Toilet riser Additional Comments: Pt is able to borrow toilet riser and tub seat from friends.  She will have friends stay 24hour for first 1-2 days then intermittently     Prior Function Level of Independence: Independent      Comments: Pt is very active and independent    PT Goals (current goals can now be found in the care plan section) Acute Rehab PT Goals Patient Stated Goal: to go home  Progress towards PT goals: Progressing toward goals    Frequency    7X/week      PT Plan Current plan remains appropriate    Co-evaluation             End of Session Equipment Utilized During Treatment: Gait belt Activity Tolerance: Patient limited by fatigue;Other (comment) (  Rudy Jewqueezie) Patient left: in chair;with chair alarm set;with call bell/phone within reach     Time: 1430-1450 PT Time Calculation (min) (ACUTE ONLY): 20 min  Charges:  $Gait Training: 8-22 mins                     G Codes:      Felecia ShellingLori Laranda Burkemper  PTA [facility]  Acute  Rehab pager      [phone]

## 2016-11-17 NOTE — Progress Notes (Signed)
Patient worked with therapy felt weak D Perkins PA notified.  Will not discharge to home today. Sharrell Ku Zanaria Morell RN

## 2016-11-17 NOTE — Progress Notes (Signed)
Physical Therapy Treatment Patient Details Name: Kaylee Hull MRN: [id number] DOB: [birth year] Today's Date: 11/17/2016    History of Present Illness Pt s/p R THR    PT Comments    POD # 2 am session Limited session due to max c/o feeling "queezie" and "nausea". Supine: 105/52(65), HR 71, RA 96% EOB: 102/55(65), HR 79, RA 99% Standing: 97/58(67), HR 88, RA 100% After amb 10 feet: 92/47(55), HR 79, RA 100% Pt not c/o dizziness, more "queezie", "woozy" Returned to room in Medical illustrator and performed some THR TE's AAROM.   Applied ICE. Pt will need another PT session this afternoon  Follow Up Recommendations  Home health PT     Equipment Recommendations  Rolling walker with 5" wheels    Recommendations for Other Services       Precautions / Restrictions Precautions Precautions: Fall Restrictions Weight Bearing Restrictions: No Other Position/Activity Restrictions: WBAT    Mobility  Bed Mobility               General bed mobility comments: OOB in recliner  Transfers Overall transfer level: Needs assistance Equipment used: Rolling walker (2 wheeled) Transfers: Sit to/from Stand Sit to Stand: Min assist         General transfer comment: 50% VC's on proper hand placement and increased time to rise  Ambulation/Gait Ambulation/Gait assistance: Min assist Ambulation Distance (Feet): 5 Feet Assistive device: Rolling walker (2 wheeled) Gait Pattern/deviations: Step-to pattern;Step-through pattern;Decreased step length - right;Decreased step length - left;Shuffle;Trunk flexed Gait velocity: decr   General Gait Details: decreased amb distance due to max c/o feeling "queezie".    Stairs            Wheelchair Mobility    Modified Rankin (Stroke Patients Only)       Balance                                    Cognition Arousal/Alertness: Awake/alert Behavior During Therapy: WFL for tasks assessed/performed Overall Cognitive Status:  Within Functional Limits for tasks assessed                      Exercises   Total Hip Replacement TE's 10 reps ankle pumps 10 reps knee presses 10 reps heel slides AAROM 10 reps SAQ's 10 reps ABD AAROM Followed by ICE    General Comments        Pertinent Vitals/Pain Pain Assessment: 0-10 Pain Score: 4  Faces Pain Scale: Hurts little more Pain Location: R hip Pain Descriptors / Indicators: Aching;Sore;Tightness Pain Intervention(s): Monitored during session;Repositioned;Ice applied    Home Living Family/patient expects to be discharged to:: Private residence Living Arrangements: Other relatives;Non-relatives/Friends Available Help at Discharge: Family Type of Home: House Home Access: Stairs to enter Entrance Stairs-Rails: None Home Layout: One level Home Equipment: Information systems manager;Toilet riser Additional Comments: Pt is able to borrow toilet riser and tub seat from friends.  She will have friends stay 24hour for first 1-2 days then intermittently     Prior Function Level of Independence: Independent      Comments: Pt is very active and independent    PT Goals (current goals can now be found in the care plan section) Acute Rehab PT Goals Patient Stated Goal: to go home  Progress towards PT goals: Progressing toward goals    Frequency    7X/week      PT Plan Current plan remains appropriate  Co-evaluation             End of Session Equipment Utilized During Treatment: Gait belt Activity Tolerance: Patient limited by fatigue;Other (comment) Patient left: in chair;with chair alarm set;with call bell/phone within reach     Time: 1056-1127 PT Time Calculation (min) (ACUTE ONLY): 31 min  Charges:  $Gait Training: 8-22 mins $Therapeutic Exercise: 8-22 mins                    G Codes:      Felecia ShellingLori Jennalynn Rivard  PTA [facility]  Acute  Rehab Pager      [phone]

## 2016-11-18 LAB — CBC
HEMATOCRIT: 25.4 % — AB (ref 36.0–46.0)
HEMOGLOBIN: 8.7 g/dL — AB (ref 12.0–15.0)
MCH: 31 pg (ref 26.0–34.0)
MCHC: 34.3 g/dL (ref 30.0–36.0)
MCV: 90.4 fL (ref 78.0–100.0)
Platelets: 179 10*3/uL (ref 150–400)
RBC: 2.81 MIL/uL — ABNORMAL LOW (ref 3.87–5.11)
RDW: 13.1 % (ref 11.5–15.5)
WBC: 4.6 10*3/uL (ref 4.0–10.5)

## 2016-11-18 NOTE — Progress Notes (Signed)
Subjective: 3 Days Post-Op Procedure(s) (LRB): RIGHT TOTAL HIP ARTHROPLASTY ANTERIOR APPROACH (Right) Patient reports pain as mild to right hip.  tolerating PO's. Reports dizziness with ambulating. Nausea improved. Progressing with PT. Denies SOB, CP, or calf pain.  Objective: Vital signs in last 24 hours: Temp:  [98 F (36.7 C)-98.6 F (37 C)] 98.5 F (36.9 C) (01/13 0511) Pulse Rate:  [61-79] 79 (01/13 0511) Resp:  [16] 16 (01/13 0511) BP: (102-118)/(53-62) 112/62 (01/13 0511) SpO2:  [95 %-100 %] 98 % (01/13 0511)  Intake/Output from previous day: 01/12 0701 - 01/13 0700 In: 1080 [P.O.:1080] Out: -  Intake/Output this shift: No intake/output data recorded.   Recent Labs  11/16/16 0442 11/17/16 0409 11/18/16 0509  HGB 9.5* 9.1* 8.7*    Recent Labs  11/17/16 0409 11/18/16 0509  WBC 5.6 4.6  RBC 2.99* 2.81*  HCT 26.5* 25.4*  PLT 178 179    Recent Labs  11/16/16 0442 11/17/16 0409  NA 134* 143  K 3.8 3.4*  CL 103 108  CO2 26 29  BUN 7 11  CREATININE 0.68 0.66  GLUCOSE 117* 106*  CALCIUM 8.1* 8.8*   No results for input(s): LABPT, INR in the last 72 hours.  Well nourished. Alert and oriented x3. RRR, Lungs clear, BS x4. Abdomen soft and non tender. Right Calf soft and non tender. Right hip dressing C/D/I. No DVT signs. Compartment soft. No signs of infection.  Right LE grossly neurovascular intact.  Assessment/Plan: 3 Days Post-Op Procedure(s) (LRB): RIGHT TOTAL HIP ARTHROPLASTY ANTERIOR APPROACH (Right) Plan to D/c home today if doing well, Call for order Monitor VS  Follow instructions F/u in office Up with PT Continue current care  STILWELL, BRYSON L 11/18/2016, 7:51 AM

## 2016-11-18 NOTE — Progress Notes (Signed)
Physical Therapy Treatment Patient Details Name: Kaylee Hull MRN: [id number] DOB: 06/15/1946 Today's Date: 11/18/2016    History of Present Illness Pt s/p R THR    PT Comments    Discussed  With OT potential of possible vestibular issues/?BPPV; OT to re-assess later, may defer pm session for PT as pt is unable to mobilize d/t her dizziness and nausea; orthostatics were checked again and pt was not orthostatic  Follow Up Recommendations  Home health PT     Equipment Recommendations  Rolling walker with 5" wheels    Recommendations for Other Services       Precautions / Restrictions Precautions Precautions: Fall Restrictions Other Position/Activity Restrictions: WBAT    Mobility  Bed Mobility Overal bed mobility: Needs Assistance Bed Mobility: Supine to Sit     Supine to sit: Min assist Sit to supine: Min assist   General bed mobility comments: extra time, assist with trunk, encouraged pt to attempt fixed gaze d/t dizziness; BPs checked, pt not orthostatic  Transfers Overall transfer level: Needs assistance Equipment used: Rolling walker (2 wheeled)   Sit to Stand: Min assist Stand pivot transfers: Min assist       General transfer comment: min assist to rise and steady, assist for balance during pivot to chair d/t dizziness/"floating" type sensation  Ambulation/Gait             General Gait Details: unable d/t dizziness and nausea   Stairs            Wheelchair Mobility    Modified Rankin (Stroke Patients Only)       Balance                                    Cognition Arousal/Alertness: Awake/alert Behavior During Therapy: WFL for tasks assessed/performed Overall Cognitive Status: Within Functional Limits for tasks assessed                      Exercises      General Comments        Pertinent Vitals/Pain Pain Assessment: 0-10 Pain Score: 2  Pain Location: R hip Pain Descriptors / Indicators:  Burning Pain Intervention(s): Limited activity within patient's tolerance;Monitored during session;Ice applied;Repositioned    Home Living                      Prior Function            PT Goals (current goals can now be found in the care plan section) Acute Rehab PT Goals Patient Stated Goal: to go home  PT Goal Formulation: With patient Time For Goal Achievement: 11/18/16 Potential to Achieve Goals: Good Progress towards PT goals: Progressing toward goals    Frequency    7X/week      PT Plan Current plan remains appropriate    Co-evaluation             End of Session Equipment Utilized During Treatment: Gait belt Activity Tolerance: Other (comment) (nausea) Patient left: in chair;with chair alarm set;with call bell/phone within reach     Time: 1007-1022 PT Time Calculation (min) (ACUTE ONLY): 15 min  Charges:  $Therapeutic Activity: 23-37 mins                    G Codes:      Quintan Saldivar 11/18/2016, 1:30 PM

## 2016-11-18 NOTE — Progress Notes (Signed)
   11/18/16 1400  OT Visit Information  Assistance Needed +1  History of Present Illness Pt s/p R THR  Precautions  Precautions Fall  Pain Assessment  Pain Location R hip  Pain Descriptors / Indicators 8, Burning/Sharp when rolling.  Repositioned, premedicated and ice provided  Cognition  Arousal/Alertness Awake/alert  Behavior During Therapy WFL for tasks assessed/performed  Overall Cognitive Status Within Functional Limits for tasks assessed  ADL  Toilet Transfer Minimal assistance;BSC;RW;Stand-pivot  Toileting- Clothing Manipulation and Hygiene Minimal assistance;Sit to/from stand  General ADL Comments tried to have pt roll to operative side.  she had sharp pain and was unable to tolerate this.  did not complete BPPV assessment.  Technically, I could perform Gilberto BetterHallpike Dix, but I would be unable to treat her if positive as she can't lay on R side and tx involves lying on bil sides  Bed Mobility  Overal bed mobility Needs Assistance  Bed Mobility Supine to Sit  Supine to sit Min assist  General bed mobility comments extra time, assist with trunk, encouraged pt to attempt fixed gaze d/t dizziness; BPs checked, pt not orthostatic  Restrictions  Other Position/Activity Restrictions WBAT  Transfers  Overall transfer level Needs assistance  Equipment used Rolling walker (2 wheeled)  Sit to Stand Min assist  Stand pivot transfers Min assist  General transfer comment min assist to rise and steady, assist for balance during pivot to chair d/t dizziness/"floating" type sensation  OT - End of Session  Activity Tolerance No increased pain  Patient left in bed;with call bell/phone within reach  OT Assessment/Plan  OT Frequency (ACUTE ONLY) Min 2X/week  Follow Up Recommendations Home health OT;Supervision/Assistance - 24 hour (and HH Aide vs SNF)  OT Equipment None recommended by OT  OT Goal Progression  Progress towards OT goals Progressing toward goals (slowly; decreased activity  tolerance)  Acute Rehab OT Goals  Patient Stated Goal to go home   OT Time Calculation  OT Start Time (ACUTE ONLY) 1328  OT Stop Time (ACUTE ONLY) 1337  OT Time Calculation (min) 9 min  OT General Charges  $OT Visit 1 Procedure  OT Treatments  $Therapeutic Activity 8-22 mins  Rincon ValleyMaryellen Jandi Hull, OTR/L [phone] 11/18/2016

## 2016-11-18 NOTE — Progress Notes (Signed)
Occupational Therapy Treatment Patient Details Name: Kaylee Hull MRN: [id number] DOB: September 10, 1946 Today's Date: 11/18/2016    History of present illness Pt s/p R THR   OT comments  Pt very fatiqued. Tolerated SPT to 3:1, donning underwear and socks this am  Follow Up Recommendations  Home health OT;Supervision/Assistance - 24 hour (vs SNF)    Equipment Recommendations  None recommended by OT    Recommendations for Other Services      Precautions / Restrictions Precautions Precautions: Fall Restrictions Weight Bearing Restrictions: No Other Position/Activity Restrictions: WBAT       Mobility Bed Mobility Overal bed mobility: Needs Assistance Bed Mobility: Supine to Sit;Sit to Supine     Supine to sit: Min guard Sit to supine: Min assist   General bed mobility comments: extra time.  Min guard for OOB with cues.  Pt able to slide leg over EOB.  Min A for RLE to slide back over in bed.  Used sheet to assist  Transfers   Equipment used: Rolling walker (2 wheeled)   Sit to Stand: Min assist Stand pivot transfers: Min assist       General transfer comment: steadying assistance.  Pt managed UE/LE herself without cues    Balance                                   ADL Overall ADL's : Needs assistance/impaired                     Lower Body Dressing: Minimal assistance;Sit to/from stand;With adaptive equipment   Toilet Transfer: Minimal assistance;BSC;RW;Stand-pivot   Toileting- Clothing Manipulation and Hygiene: Minimal assistance;Sit to/from stand         General ADL Comments: light minA for sit to stand and transfer.  Pt with c/o dizziness, thinks moving.  ? BPPV--unsure if pt will tolerate assessment for this which involves lying on affected side.  BPs sitting:  109/55, 111/50 and lying back in bed 105/50.      Vision                     Perception     Praxis      Cognition   Behavior During Therapy: WFL for  tasks assessed/performed Overall Cognitive Status: Within Functional Limits for tasks assessed                       Extremity/Trunk Assessment               Exercises     Shoulder Instructions       General Comments      Pertinent Vitals/ Pain       Pain Score: 2  Pain Location: R hip Pain Descriptors / Indicators: Aching Pain Intervention(s): Limited activity within patient's tolerance;Monitored during session;Premedicated before session;Repositioned  Home Living                                          Prior Functioning/Environment              Frequency  Min 2X/week        Progress Toward Goals  OT Goals(current goals can now be found in the care plan section)  Progress towards OT goals: Progressing toward goals (slowly)  Plan      Co-evaluation                 End of Session     Activity Tolerance Patient limited by fatigue   Patient Left in bed;with call bell/phone within reach   Nurse Communication          Time: 1610-9604 OT Time Calculation (min): 36 min  Charges: OT General Charges $OT Visit: 1 Procedure OT Treatments $Self Care/Home Management : 23-37 mins  Kaylee Hull 11/18/2016, 10:36 AM  Marica Otter, OTR/L [phone] 11/18/2016

## 2016-11-19 NOTE — Care Management Important Message (Signed)
Important Message  Patient Details  Name: Kaylee Hull MRN: [id number] Date of Birth: 07-03-1946   Medicare Important Message Given:  Yes    Antony HasteBennett, Sakai Wolford Harris, RN 11/19/2016, 11:53 AM

## 2016-11-19 NOTE — Progress Notes (Signed)
Occupational Therapy Treatment and vestibular assessment Patient Details Name: Kaylee Hull MRN: [id number] DOB: 1946/07/13 Today's Date: 11/19/2016    History of present illness Pt s/p R THR   OT comments  Pt tested negative for BPPV/hypofunction, but she continues to report dizziness and nausea.  She will need consistent 24/7 at home with someone who can steady her if needed.    Follow Up Recommendations  Home health OT;Supervision/Assistance - 24 hour (vs SNF, if she cannot arrange this)    Equipment Recommendations  None recommended by OT    Recommendations for Other Services      Precautions / Restrictions Precautions Precautions: Fall Restrictions Other Position/Activity Restrictions: WBAT       Mobility Bed Mobility         Supine to sit: Min assist Sit to supine: Min assist   General bed mobility comments: assist for RLE, may benefit from leg lifter  Transfers   Equipment used: Rolling walker (2 wheeled)   Sit to Stand: Min guard;Min assist         General transfer comment: min guard from 3:1; min A from bed.  Pt continues to need cues for LE placement    Balance                                   ADL       Grooming: Wash/dry hands;Min guard;Bed level                   Toilet Transfer: Min guard;Ambulation;BSC;RW   Toileting- Architect and Hygiene: Min guard;Sit to/from stand         General ADL Comments: ambulated to bathroom and performed above activities.  Educated on shower transfer, but did not perform this session.  Pt does not have stamina to take a shower at this time.  Pt did stand at the sink to have hair washed.   Vestibular:   pt has been c/o dizziness during OT. She has described this as things swimming.  One time, she described spinning and she has felt nauseous.  Assessed VOR and HT, and they were WFLs.  Also assessed BPPV via Hallpike Dix and horizontal canals with head roll.  All of these  were negative.  Pt continued to report dizziness and nausea during session, sometimes with head movement and sometimes without      Vision                     Perception     Praxis      Cognition   Behavior During Therapy: [facility] for tasks assessed/performed Overall Cognitive Status: Within Functional Limits for tasks assessed                       Extremity/Trunk Assessment               Exercises     Shoulder Instructions       General Comments      Pertinent Vitals/ Pain       Pain Score: 2  Pain Location: R hip Pain Descriptors / Indicators: Sore Pain Intervention(s): Limited activity within patient's tolerance;Monitored during session;Repositioned  Home Living  Prior Functioning/Environment              Frequency  Min 2X/week        Progress Toward Goals  OT Goals(current goals can now be found in the care plan section)  Progress towards OT goals: Progressing toward goals (slowly)  Acute Rehab OT Goals Patient Stated Goal: to go home   Plan      Co-evaluation    PT/OT/SLP Co-Evaluation/Treatment: Yes Reason for Co-Treatment: Other (comment) (+2 earlier with NT and to assess vestibular system) PT goals addressed during session: Mobility/safety with mobility OT goals addressed during session: ADL's and self-care      End of Session     Activity Tolerance No increased pain   Patient Left in chair;with call bell/phone within reach;with bed alarm set   Nurse Communication          Time: [id number] OT Time Calculation (min): 45 min  Charges: OT General Charges $OT Visit: 1 Procedure OT Treatments $Self Care/Home Management : 8-22 mins  Areen Trautner 11/19/2016, 12:02 PM  Kaylee Hull, OTR/L [phone] 11/19/2016

## 2016-11-19 NOTE — Progress Notes (Signed)
     Subjective: 4 Days Post-Op Procedure(s) (LRB): RIGHT TOTAL HIP ARTHROPLASTY ANTERIOR APPROACH (Right)   Patient reports pain as mild, pain controlled. No events throughout the night. Reports dizziness while sitting in the bed and with ambulating.  She states that she is also having significant nausea, but has not vomited.  Patient has not much in the way of analgesic medications.  It was felt that throughout the day the patient's symptoms appeared to decrease.  Objective:   VITALS:   Vitals:   11/18/16 2100 11/19/16 0500  BP: (!) 115/58 (!) 107/52  Pulse: 68 71  Resp: 16 20  Temp: 98.9 F (37.2 C) 99.1 F (37.3 C)    Dorsiflexion/Plantar flexion intact Incision: dressing C/D/I No cellulitis present Compartment soft  LABS  Recent Labs  11/17/16 0409 11/18/16 0509  HGB 9.1* 8.7*  HCT 26.5* 25.4*  WBC 5.6 4.6  PLT 178 179     Recent Labs  11/17/16 0409  NA 143  K 3.4*  BUN 11  CREATININE 0.66  GLUCOSE 106*     Assessment/Plan: 4 Days Post-Op Procedure(s) (LRB): RIGHT TOTAL HIP ARTHROPLASTY ANTERIOR APPROACH (Right) Up with therapy Discharge home with home health Monitor VS  Continue current care    Anastasio AuerbachMatthew S. Dannya Pitkin   PAC  11/19/2016, 8:27 AM

## 2016-11-19 NOTE — Progress Notes (Signed)
Physical Therapy Treatment Patient Details Name: Kaylee Hull MRN: [id number] DOB: 03-28-1946 Today's Date: 11/19/2016    History of Present Illness Pt s/p R THR    PT Comments    Pt continues to have limited progress d/t intermittent dizziness and nausea; see OT note for further vestibular information although pt was negative for BPPV; will continue to follow; pt is struggling with D/C plan as she has no assist at home or friends that are comfortable assisting her should she need ~min to min/guard assist for safety; she is at risk for falls d/t the dizziness   Follow Up Recommendations  Home health PT;Supervision for mobility/OOB     Equipment Recommendations  Rolling walker with 5" wheels    Recommendations for Other Services       Precautions / Restrictions Precautions Precautions: Fall Restrictions Other Position/Activity Restrictions: WBAT    Mobility  Bed Mobility Overal bed mobility: Needs Assistance       Supine to sit: Min assist Sit to supine: Min assist   General bed mobility comments: assist for RLE, may benefit from leg lifter  Transfers Overall transfer level: Needs assistance Equipment used: Rolling walker (2 wheeled) Transfers: Sit to/from Stand Sit to Stand: Min guard;Min assist         General transfer comment: min guard from 3:1; min A from bed.  Pt continues to need cues for LE placement  Ambulation/Gait Ambulation/Gait assistance: Min assist Ambulation Distance (Feet): 12 Feet (x2) Assistive device: Rolling walker (2 wheeled) Gait Pattern/deviations: Step-to pattern;Step-through pattern;Decreased step length - right;Decreased step length - left;Shuffle;Trunk flexed Gait velocity: decr   General Gait Details: limited by fatigue,dizziness, nausea   Stairs            Wheelchair Mobility    Modified Rankin (Stroke Patients Only)       Balance           Standing balance support: Bilateral upper extremity  supported;During functional activity Standing balance-Leahy Scale: Poor Standing balance comment: pt stood at sink to wash hair; good tolerance although needed UE support and close supervision to min/guard d/t intermittent dizziness                    Cognition Arousal/Alertness: Awake/alert Behavior During Therapy: WFL for tasks assessed/performed Overall Cognitive Status: Within Functional Limits for tasks assessed                      Exercises      General Comments        Pertinent Vitals/Pain Pain Score: 2  Pain Location: R hip Pain Descriptors / Indicators: Sore Pain Intervention(s): Limited activity within patient's tolerance;Monitored during session;Repositioned    Home Living                      Prior Function            PT Goals (current goals can now be found in the care plan section) Acute Rehab PT Goals Patient Stated Goal: to go home  PT Goal Formulation: With patient Time For Goal Achievement: 11/21/16 Potential to Achieve Goals: Good Progress towards PT goals: Progressing toward goals    Frequency    7X/week      PT Plan Current plan remains appropriate    Co-evaluation   Reason for Co-Treatment: Other (comment) (vestibular assessment) PT goals addressed during session: Mobility/safety with mobility OT goals addressed during session: ADL's and self-care     End  of Session Equipment Utilized During Treatment: Gait belt Activity Tolerance: Other (comment) (limited by nausea and dizziness) Patient left: in chair;with chair alarm set;with call bell/phone within reach     Time: [id number] PT Time Calculation (min) (ACUTE ONLY): 42 min  Charges:  $Gait Training: 8-22 mins $Therapeutic Activity: 8-22 mins                    G Codes:      Devron Cohick 11/19/2016, 1:03 PM

## 2016-11-19 NOTE — Progress Notes (Signed)
   11/19/16 1500  PT Visit Information  Last PT Received On 11/19/16  Pt continues to be limited by nausea and dizziness/"weakness"; deferred amb d/t this but pt did amb earlier and sit in chair most of day; agreeable to ex's  Assistance Needed +1  History of Present Illness Pt s/p R THR  Subjective Data  Patient Stated Goal to go home and feel better!  Precautions  Precautions Fall  Restrictions  Other Position/Activity Restrictions WBAT  Pain Assessment  Pain Score 1  Pain Location R hip  Pain Descriptors / Indicators Burning  Pain Intervention(s) Limited activity within patient's tolerance;Monitored during session;Repositioned  Cognition  Arousal/Alertness Awake/alert  Behavior During Therapy WFL for tasks assessed/performed  Overall Cognitive Status Within Functional Limits for tasks assessed  Total Joint Exercises  Ankle Circles/Pumps AROM;Both;15 reps;Supine  Quad Sets AROM;Both;10 reps;Supine  Heel Slides AAROM;Right;Supine;15 reps  Hip ABduction/ADduction AAROM;Right;15 reps;Supine  Short Arc Quad AROM;Strengthening;Right;15 reps  PT - End of Session  Activity Tolerance Patient tolerated treatment well  Patient left in chair;with call bell/phone within reach;with chair alarm set;with family/visitor present  PT - Assessment/Plan  PT Plan Current plan remains appropriate  PT Frequency (ACUTE ONLY) 7X/week  Follow Up Recommendations Home health PT;Supervision for mobility/OOB  PT equipment Rolling walker with 5" wheels  PT Goal Progression  Progress towards PT goals Progressing toward goals  Acute Rehab PT Goals  PT Goal Formulation With patient  Time For Goal Achievement 11/21/16  Potential to Achieve Goals Good  PT Time Calculation  PT Start Time (ACUTE ONLY) 1430  PT Stop Time (ACUTE ONLY) 1450  PT Time Calculation (min) (ACUTE ONLY) 20 min  PT General Charges  $$ ACUTE PT VISIT 1 Procedure  PT Treatments  $Therapeutic Exercise 8-22 mins

## 2016-11-20 ENCOUNTER — Inpatient Hospital Stay (HOSPITAL_COMMUNITY): Payer: Medicare Other

## 2016-11-20 DIAGNOSIS — E785 Hyperlipidemia, unspecified: Secondary | ICD-10-CM

## 2016-11-20 DIAGNOSIS — R55 Syncope and collapse: Secondary | ICD-10-CM

## 2016-11-20 DIAGNOSIS — M810 Age-related osteoporosis without current pathological fracture: Secondary | ICD-10-CM

## 2016-11-20 DIAGNOSIS — R42 Dizziness and giddiness: Secondary | ICD-10-CM

## 2016-11-20 LAB — TSH: TSH: 5.413 u[IU]/mL — AB (ref 0.350–4.500)

## 2016-11-20 LAB — BASIC METABOLIC PANEL
ANION GAP: 6 (ref 5–15)
BUN: 13 mg/dL (ref 6–20)
CALCIUM: 8.7 mg/dL — AB (ref 8.9–10.3)
CO2: 30 mmol/L (ref 22–32)
Chloride: 103 mmol/L (ref 101–111)
Creatinine, Ser: 0.63 mg/dL (ref 0.44–1.00)
Glucose, Bld: 115 mg/dL — ABNORMAL HIGH (ref 65–99)
POTASSIUM: 3.8 mmol/L (ref 3.5–5.1)
Sodium: 139 mmol/L (ref 135–145)

## 2016-11-20 LAB — URINALYSIS, ROUTINE W REFLEX MICROSCOPIC
BACTERIA UA: NONE SEEN
BILIRUBIN URINE: NEGATIVE
Glucose, UA: NEGATIVE mg/dL
Ketones, ur: NEGATIVE mg/dL
LEUKOCYTES UA: NEGATIVE
NITRITE: NEGATIVE
PH: 7 (ref 5.0–8.0)
Protein, ur: NEGATIVE mg/dL
SPECIFIC GRAVITY, URINE: 1.012 (ref 1.005–1.030)

## 2016-11-20 LAB — CBC
HCT: 27.5 % — ABNORMAL LOW (ref 36.0–46.0)
Hemoglobin: 9.3 g/dL — ABNORMAL LOW (ref 12.0–15.0)
MCH: 30 pg (ref 26.0–34.0)
MCHC: 33.8 g/dL (ref 30.0–36.0)
MCV: 88.7 fL (ref 78.0–100.0)
PLATELETS: 214 10*3/uL (ref 150–400)
RBC: 3.1 MIL/uL — AB (ref 3.87–5.11)
RDW: 12.9 % (ref 11.5–15.5)
WBC: 4.2 10*3/uL (ref 4.0–10.5)

## 2016-11-20 LAB — CORTISOL: CORTISOL PLASMA: 9 ug/dL

## 2016-11-20 MED ORDER — SODIUM CHLORIDE 0.9 % IV BOLUS (SEPSIS)
250.0000 mL | Freq: Once | INTRAVENOUS | Status: AC
  Administered 2016-11-20: 10:00:00 250 mL via INTRAVENOUS

## 2016-11-20 MED ORDER — POLYSACCHARIDE IRON COMPLEX 150 MG PO CAPS
150.0000 mg | ORAL_CAPSULE | Freq: Every day | ORAL | Status: DC
  Administered 2016-11-21: 150 mg via ORAL
  Filled 2016-11-20: qty 1

## 2016-11-20 MED ORDER — SODIUM CHLORIDE 0.9 % IV SOLN
INTRAVENOUS | Status: DC
  Administered 2016-11-20: 11:00:00 via INTRAVENOUS

## 2016-11-20 MED ORDER — METHOCARBAMOL 500 MG PO TABS: 500.0000 mg | ORAL_TABLET | Freq: Three times a day (TID) | ORAL | Status: DC | PRN

## 2016-11-20 NOTE — Consult Note (Signed)
Medical Consultation   Kaylee Hull  ZOX:[id number]  DOB: 06-May-1946  DOA: 11/15/2016  PCP: Garlan FillersPATERSON,DANIEL G, MD   Requesting physician: Dr. Despina HickAlusio  Reason for consultation: Near syncope   History of Present Illness: Kaylee Hull is an 71 y.o. female with PMH significant for hypothyroidism, depression, osteoporosis and HLD; who was admitted for right hip replacement due to severe OA. Patient had procedure done on 11/15/16 without complications. After surgery has struggle with lightheadedness and dizziness upon standing; experiencing and significant orthostatic hypotension/near syncope today. [facility] called to assess and provide further recommendations.  Of note, patient has not been receiving pain meds for the last 72 hours, she is afebrile, no vomiting, no dysuria, no HA's, no vision trouble, focal weakness and denies any CP or palpitations.   Review of Systems:  ROS As per HPI otherwise 10 point review of systems negative.     Past Medical History: Past Medical History:  Diagnosis Date  . Abnormal EKG   . Anemia   . Anxiety   . Arthritis   . Depression   . Heart murmur    mentions possible asymptomatic heart murumur years ago  . Hyperlipidemia   . Hypothyroidism   . Insomnia 04/27/2014  . Osteopenia   . Snoring 04/27/2014    Past Surgical History: Past Surgical History:  Procedure Laterality Date  . COLONOSCOPY  12/08   several polyps removed  . laser throat  1992  . TONSILLECTOMY     age 89  . TOTAL HIP ARTHROPLASTY Right 11/15/2016   Procedure: RIGHT TOTAL HIP ARTHROPLASTY ANTERIOR APPROACH;  Surgeon: Ollen GrossFrank Aluisio, MD;  Location: [facility];  Service: Orthopedics;  Laterality: Right;  . 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 Other     Bee stings-swelling/discomfort  . Sulfa Antibiotics Hives    Social History:  reports that she quit smoking about 25 years ago. She has never used smokeless  tobacco. She reports that she does not drink alcohol or use drugs.   Family History: Family History  Problem Relation Age of Onset  . Liver cancer Father   . Diabetes Mother   . Coronary artery disease Mother   . Lung cancer Mother     Physical Exam: Vitals:   11/20/16 0905 11/20/16 0910 11/20/16 0915 11/20/16 1505  BP: (!) 64/35 (!) 104/55 (!) 115/57 129/62  Pulse: (!) 52 (!) 49  71  Resp:    18  Temp:    98.8 F (37.1 C)  TempSrc:    Oral  SpO2:    99%  Weight:      Height:        Constitutional: afebrile, calm and in no distress,  Alert and awake, oriented x3, denies CP and SOB. Able to follow commands properly and w/o signs of any neurologic deficit. Eyes: PERLA, EOMI, irises appear normal, no icterus, no nystagmus ENMT: external ears and nose appear normal,normal hearing. Lips appears normal, oropharynx mucosa, tongue, posterior pharynx appear normal  Neck: neck appears normal, no masses, normal ROM, no thyromegaly, no JVD , no bruits CVS: S1-S2 clear, no murmur rubs or gallops, no LE edema, normal pedal pulses  Respiratory:  clear to auscultation bilaterally, no wheezing, rales or rhonchi. Respiratory effort normal. No accessory muscle use.  Abdomen: soft nontender, nondistended, normal bowel sounds, no hepatosplenomegaly, no hernias  Musculoskeletal: : no cyanosis, clubbing  or edema noted bilaterally Neuro: Cranial nerves II-XII intact, strength, sensation, reflexes Psych: judgement and insight appear normal, stable mood and affect, mental status Skin: no rashes, no bruises. clean dressings on her right leg from hip replacement.  Data reviewed:  I have personally reviewed following labs and imaging studies Labs:  CBC:  Recent Labs Lab 11/16/16 0442 11/17/16 0409 11/18/16 0509 11/20/16 0708  WBC 6.0 5.6 4.6 4.2  HGB 9.5* 9.1* 8.7* 9.3*  HCT 27.5* 26.5* 25.4* 27.5*  MCV 90.2 88.6 90.4 88.7  PLT 181 178 179 214    Basic Metabolic Panel:  Recent Labs Lab  11/16/16 0442 11/17/16 0409 11/20/16 0708  NA 134* 143 139  K 3.8 3.4* 3.8  CL 103 108 103  CO2 26 29 30   GLUCOSE 117* 106* 115*  BUN 7 11 13   CREATININE 0.68 0.66 0.63  CALCIUM 8.1* 8.8* 8.7*   GFR Estimated Creatinine Clearance: 58.9 mL/min (by C-G formula based on SCr of 0.63 mg/dL).  Thyroid function studies  Recent Labs  11/20/16 1425  TSH 5.413*   Urinalysis    Component Value Date/Time   COLORURINE YELLOW 11/20/2016 1747   APPEARANCEUR CLEAR 11/20/2016 1747   LABSPEC 1.012 11/20/2016 1747   PHURINE 7.0 11/20/2016 1747   GLUCOSEU NEGATIVE 11/20/2016 1747   HGBUR SMALL (A) 11/20/2016 1747   BILIRUBINUR NEGATIVE 11/20/2016 1747   KETONESUR NEGATIVE 11/20/2016 1747   PROTEINUR NEGATIVE 11/20/2016 1747   NITRITE NEGATIVE 11/20/2016 1747   LEUKOCYTESUR NEGATIVE 11/20/2016 1747    Microbiology No results found for this or any previous visit (from the past 240 hour(s)).   Inpatient Medications:   Scheduled Meds: . docusate sodium  100 mg Oral BID  . iron polysaccharides  150 mg Oral Daily  . levothyroxine  50 mcg Oral QAC breakfast  . rivaroxaban  10 mg Oral Q breakfast  . sertraline  100 mg Oral Daily  . simvastatin  40 mg Oral QPM   Continuous Infusions: . sodium chloride Stopped (11/16/16 1006)  . sodium chloride 75 mL/hr at 11/20/16 1100     Radiological Exams on Admission: Ct Head Wo Contrast  Result Date: 11/20/2016 CLINICAL DATA:  Hip replacement 5 days ago with recent syncopal episode and dizziness. EXAM: CT HEAD WITHOUT CONTRAST TECHNIQUE: Contiguous axial images were obtained from the base of the skull through the vertex without intravenous contrast. COMPARISON:  None. FINDINGS: Brain: Ventricles, cisterns and other CSF spaces are within normal. There is no mass, mass effect, shift of midline structures or acute hemorrhage. There is no evidence of acute infarction. Vascular: Minimal calcified plaque over the cavernous segment of the internal  carotid arteries. Skull: Within normal. Sinuses/Orbits: Within normal. Other: None. IMPRESSION: No acute intracranial findings. Electronically Signed   By: Elberta Fortis M.D.   On: 11/20/2016 15:56    Impression/Recommendations 1-OA (osteoarthritis) of hip: s/p right THR -further management per orthopedic service -recovering and healing well -pain controlled, and only requiring tylenol -continue PT/OT -xarelto for DVT prophylaxis  -weight bearing as tolerated  2-lightheadedness and dizzines   -appears to be secondary to orthostasis -patient most likely dry prior to surgery and subsequently behind in volume shifting/adjustment. -CT head unremarkable, r/o any component of posterior circulation problem -TSH close to normal; continue synthroid -slight anemia, no transfusion needed at this point. Started on niferex daily -cortisol level WNL -symptoms improved/resolved with IVF's boluses. Will recommend another continue IVF's and increased to 100 cc/her for another 12 hours. -patient advised to maintain adequate  fluid intake and to increase slightly more sodium intake. -will benefit of stocking socks while up -will follow orthostatic VS in am -avoid use of ambien and minimize use of robaxin -no narcotics if possible -no signs of infection in her UA -if symptoms persisted might try PRN meclizine.  3-hypothyroidism -continue synthroid -repeat thyroid function panel in 4 weeks  4-HLD -continue zocor  5-depression: -continue zoloft  6-osteoporosis -resume fosamax and drisdol as an outpatient   Thank you for this consultation.  Our [facility] hospitalist team will follow the patient with you.   Time Spent: 45 minutes  Vassie Loll M.D. [facility] 11/20/2016, 9:05 PM

## 2016-11-20 NOTE — Progress Notes (Addendum)
Physical Therapy Treatment Patient Details Name: Kaylee Hull MRN: [id number] DOB: May 21, 1946 Today's Date: 11/20/2016    History of Present Illness Pt s/p R THR    PT Comments    POD # 5 am session SYNCOPE episode Progressing slowly with several days c/o "dizziness", and feeling "queezie".  Assisted OOB to amb to bathroom pt was feeling "okay".  Assisted out of bathroom pt had to sit back down on EOB before amb in hallway with c/o feeling "weak" and "not right".  Offered fluids and a 4 min rest break.  Started to amb in hallway but only 8 feet when pt became limp and verbally unresponsive with vitals taken just before BP 64/35, HR 48, RA 100%.  Pt assisted to recliner that was already behind her and RN called to hallway.  Pt placed in supine position with B LE's elevated and pt slowly came back around but vitals again BP 104/55, HR 56 and RA 99%.  Follow Up Recommendations        Equipment Recommendations       Recommendations for Other Services       Precautions / Restrictions Precautions Precautions: Fall Restrictions Weight Bearing Restrictions: No Other Position/Activity Restrictions: WBAT    Mobility  Bed Mobility Overal bed mobility: Needs Assistance Bed Mobility: Supine to Sit     Supine to sit: Min assist     General bed mobility comments: assist for RLE and increased time   Transfers Overall transfer level: Needs assistance Equipment used: Rolling walker (2 wheeled) Transfers: Sit to/from Stand Sit to Stand: Min guard;Min assist         General transfer comment: assisted off bed and on/off toilet with increased time and increased c/o feeling "queezie" and needed to rest before attempting amb in hallway  Ambulation/Gait Ambulation/Gait assistance: Min guard;Min assist Ambulation Distance (Feet): 8 Feet Assistive device: Rolling walker (2 wheeled) Gait Pattern/deviations: Step-to pattern;Step-through pattern;Decreased step length - right;Decreased  step length - left;Shuffle;Trunk flexed     General Gait Details: amb distance limited by syncope episode.  Immediately assisted to recliner + 2 and placed in supine position with B LE elevated. RN called to hallway.  Pt verbally unresponsive.  Vitals taken.     Stairs            Wheelchair Mobility    Modified Rankin (Stroke Patients Only)       Balance                                    Cognition Arousal/Alertness: Awake/alert Behavior During Therapy: WFL for tasks assessed/performed Overall Cognitive Status: Within Functional Limits for tasks assessed                      Exercises      General Comments        Pertinent Vitals/Pain Pain Assessment: 0-10 Pain Score: 2  Pain Location: R hip Pain Descriptors / Indicators: Tender Pain Intervention(s): Monitored during session;Repositioned    Home Living                      Prior Function            PT Goals (current goals can now be found in the care plan section) Progress towards PT goals: Progressing toward goals    Frequency           PT  Plan      Co-evaluation             End of Session           Time: [id number] PT Time Calculation (min) (ACUTE ONLY): 29 min  Charges:  $Gait Training: 8-22 mins $Therapeutic Activity: 8-22 mins                    G Codes:      Kaylee Hull  PTA [facility]  Acute  Rehab Pager      [phone]

## 2016-11-20 NOTE — Progress Notes (Signed)
Patient up with therapy to bathroom stayed for several minutes, then amb to door out to hall, felt weal became diaphoretic, placed in chair b/p 64/35 hr 49.Marland Kitchen.Marland Kitchen.rechecked 104/55 hr 49.  Dr  Lequita HaltAluisio paged with orders received.

## 2016-11-20 NOTE — Progress Notes (Signed)
OT Cancellation Note  Patient Details Name: Devota PaceLucille B Allocca MRN: [id number] DOB: May 20, 1946   Cancelled Treatment:    Reason Eval/Treat Not Completed: Medical issues which prohibited therapy. Pt with syncopal episode with PT.  Will defer this am, and check back later as appropriate.   Benancio Osmundson Knoxonarpe, OTR/L [phone]   Jeani HawkingConarpe, Bryan Omura M 11/20/2016, 9:41 AM

## 2016-11-20 NOTE — Progress Notes (Signed)
Occupational Therapy Treatment Patient Details Name: Kaylee Hull MRN: [id number] DOB: Nov 21, 1945 Today's Date: 11/20/2016    History of present illness Pt s/p R THR   OT comments  Pt limited by fatigue and mild dizziness this pm.  She requires minA  For bed mobility.  Due to pt's slow progression, limited activity tolerance, and lack of 24 hour assistance, feel she may benefit from short term rehab at SNF level to allow her to return to mod I.    BP supine 118/67, HR 64 Sitting 131/70, HR 62 Standing 116/66, HR 67 Standing 3 mins 129/84   Follow Up Recommendations  Supervision/Assistance - 24 hour;SNF    Equipment Recommendations  None recommended by OT    Recommendations for Other Services      Precautions / Restrictions Precautions Precautions: Fall Restrictions Weight Bearing Restrictions: No Other Position/Activity Restrictions: WBAT       Mobility Bed Mobility Overal bed mobility: Needs Assistance Bed Mobility: Supine to Sit;Sit to Supine     Supine to sit: Min assist Sit to supine: Min assist   General bed mobility comments: Assist for Rt LE   Transfers Overall transfer level: Needs assistance Equipment used: Rolling walker (2 wheeled) Transfers: Sit to/from [employer] Sit to Stand: Min guard Stand pivot transfers: Min guard            Balance Overall balance assessment: Needs assistance         Standing balance support: Bilateral upper extremity supported Standing balance-Leahy Scale: Poor Standing balance comment: requires min guard assist and bil UE support                    ADL                           Toilet Transfer: Min Nurse, learning disability Details (indicate cue type and reason): Pt very guarded with movement and activity          Functional mobility during ADLs: Min guard;Rolling walker        Vision                     Perception     Praxis      Cognition    Behavior During Therapy: WFL for tasks assessed/performed Overall Cognitive Status: Within Functional Limits for tasks assessed                       Extremity/Trunk Assessment               Exercises     Shoulder Instructions       General Comments      Pertinent Vitals/ Pain       Pain Assessment: Faces Faces Pain Scale: Hurts little more Pain Location: R hip Pain Descriptors / Indicators: Grimacing;Guarding Pain Intervention(s): Repositioned;Monitored during session  Home Living                                          Prior Functioning/Environment              Frequency  Min 2X/week        Progress Toward Goals  OT Goals(current goals can now be found in the care plan section)  Progress towards OT goals: Not progressing toward goals - comment (fatigue )  Plan Discharge plan needs to be updated    Co-evaluation                 End of Session Equipment Utilized During Treatment: Rolling walker;Gait belt   Activity Tolerance Patient limited by fatigue   Patient Left in bed;with call bell/phone within reach   Nurse Communication Mobility status        Time: [id number] OT Time Calculation (min): 23 min  Charges: OT General Charges $OT Visit: 1 Procedure OT Treatments $Therapeutic Activity: 23-37 mins  Koleson Reifsteck M 11/20/2016, 5:44 PM

## 2016-11-21 ENCOUNTER — Encounter (HOSPITAL_COMMUNITY): Payer: Self-pay | Admitting: Internal Medicine

## 2016-11-21 DIAGNOSIS — F32A Depression, unspecified: Secondary | ICD-10-CM | POA: Diagnosis present

## 2016-11-21 DIAGNOSIS — E785 Hyperlipidemia, unspecified: Secondary | ICD-10-CM | POA: Diagnosis present

## 2016-11-21 DIAGNOSIS — E039 Hypothyroidism, unspecified: Secondary | ICD-10-CM

## 2016-11-21 DIAGNOSIS — F329 Major depressive disorder, single episode, unspecified: Secondary | ICD-10-CM

## 2016-11-21 DIAGNOSIS — I951 Orthostatic hypotension: Secondary | ICD-10-CM

## 2016-11-21 DIAGNOSIS — M1611 Unilateral primary osteoarthritis, right hip: Principal | ICD-10-CM

## 2016-11-21 HISTORY — DX: Hypothyroidism, unspecified: E03.9

## 2016-11-21 MED ORDER — RIVAROXABAN 10 MG PO TABS: 10.0000 mg | ORAL_TABLET | Freq: Every day | ORAL | 0 refills | Status: DC

## 2016-11-21 MED ORDER — POLYSACCHARIDE IRON COMPLEX 150 MG PO CAPS: 150.0000 mg | ORAL_CAPSULE | Freq: Every day | ORAL | 0 refills | Status: DC

## 2016-11-21 NOTE — Progress Notes (Signed)
Pt refused first dose of Niferex this evening. RN reviewed purpose, as well as MD note, however pt states that she has tried iron supplements in the past with no success. Patient states that MD can discuss with her tomorrow, but that she did not want to take at this time.

## 2016-11-21 NOTE — Progress Notes (Signed)
Consult/ PROGRESS NOTE    Kaylee Hull  ZOX:[id number] DOB: 09-20-1946 DOA: 11/15/2016 PCP: Garlan Fillers, MD    Assessment & Plan:   Principal Problem:   OA (osteoarthritis) of hip Active Problems:   Orthostasis   Hypothyroidism   Hyperlipidemia   Depression  #1 osteoarthritis status post right THR  WBAT. Per primary team.   #2 orthostasis Patient was noted to have lightheadedness and dizziness on 11/20/2016 felt likely to be secondary to orthostatics. Patient was noted to be hypotensive and felt likely secondary to hypovolemia. CT head negative. TSH close to normal and patient on home regimen of Synthroid. Patient with a iron deficiency anemia on oral iron supplementation. Patient with no signs or symptoms of infection. Hemoglobin stable. Cortisol level within normal limits. Patient was hydrated with IV fluids with resolution of orthostasis and clinical improvement. Outpatient follow-up.  #3 hypothyroidism Continue Synthroid. Repeat thyroid function studies in 4-6 weeks.  4 hyperlipidemia Continue statin.  #5 depression  Zoloft.  #6 osteoporosis Resume Fosamax and Drisdol as an outpatient.   DVT prophylaxis: Xarelto. Per orthopedics. Code Status: Full Family Communication: Updated patient and family at bedside. Disposition Plan: Okay to discharge home from a medicine standpoint. Per primary team.   Consultants:   Internal medicine: Dr. Gwenlyn Perking 11/20/2016  Procedures:   CT head 11/20/2016  Plain films of the pelvis 11/15/2016  Right total hip arthroplasty per Dr.Aluiso 11/15/2016  Antimicrobials:   None   Subjective: Patient denies dizziness and SOB .No light headedness. Patient denies any chest pain.  Objective: Vitals:   11/20/16 1505 11/20/16 2153 11/21/16 0622 11/21/16 0921  BP: 129/62 123/63 116/63 121/75  Pulse: 71 64 72 76  Resp: 18 16 16 16   Temp: 98.8 F (37.1 C) 97.5 F (36.4 C) 98.6 F (37 C) 97.6 F (36.4 C)  TempSrc: Oral  Oral Oral Oral  SpO2: 99% 93% 95%   Weight:      Height:        Intake/Output Summary (Last 24 hours) at 11/21/16 1226 Last data filed at 11/21/16 8119  Gross per 24 hour  Intake             2265 ml  Output                0 ml  Net             2265 ml   Filed Weights   11/15/16 0823  Weight: 61.2 kg (135 lb)    Examination:  General exam: Appears calm and comfortable  Respiratory system: Clear to auscultation. Respiratory effort normal. Cardiovascular system: S1 & S2 heard, RRR. No JVD, murmurs, rubs, gallops or clicks. No pedal edema. Gastrointestinal system: Abdomen is nondistended, soft and nontender. No organomegaly or masses felt. Normal bowel sounds heard. Central nervous system: Alert and oriented. No focal neurological deficits. Extremities: Symmetric 5 x 5 power. Skin: No rashes, lesions or ulcers Psychiatry: Judgement and insight appear normal. Mood & affect appropriate.     Data Reviewed: I have personally reviewed following labs and imaging studies  CBC:  Recent Labs Lab 11/16/16 0442 11/17/16 0409 11/18/16 0509 11/20/16 0708  WBC 6.0 5.6 4.6 4.2  HGB 9.5* 9.1* 8.7* 9.3*  HCT 27.5* 26.5* 25.4* 27.5*  MCV 90.2 88.6 90.4 88.7  PLT 181 178 179 214   Basic Metabolic Panel:  Recent Labs Lab 11/16/16 0442 11/17/16 0409 11/20/16 0708  NA 134* 143 139  K 3.8 3.4* 3.8  CL 103 108 103  CO2 26 29 30   GLUCOSE 117* 106* 115*  BUN 7 11 13   CREATININE 0.68 0.66 0.63  CALCIUM 8.1* 8.8* 8.7*   GFR: Estimated Creatinine Clearance: 58.9 mL/min (by C-G formula based on SCr of 0.63 mg/dL). Liver Function Tests: No results for input(s): AST, ALT, ALKPHOS, BILITOT, PROT, ALBUMIN in the last 168 hours. No results for input(s): LIPASE, AMYLASE in the last 168 hours. No results for input(s): AMMONIA in the last 168 hours. Coagulation Profile: No results for input(s): INR, PROTIME in the last 168 hours. Cardiac Enzymes: No results for input(s): CKTOTAL, CKMB,  CKMBINDEX, TROPONINI in the last 168 hours. BNP (last 3 results) No results for input(s): PROBNP in the last 8760 hours. HbA1C: No results for input(s): HGBA1C in the last 72 hours. CBG: No results for input(s): GLUCAP in the last 168 hours. Lipid Profile: No results for input(s): CHOL, HDL, LDLCALC, TRIG, CHOLHDL, LDLDIRECT in the last 72 hours. Thyroid Function Tests:  Recent Labs  11/20/16 1425  TSH 5.413*   Anemia Panel: No results for input(s): VITAMINB12, FOLATE, FERRITIN, TIBC, IRON, RETICCTPCT in the last 72 hours. Sepsis Labs: No results for input(s): PROCALCITON, LATICACIDVEN in the last 168 hours.  No results found for this or any previous visit (from the past 240 hour(s)).       Radiology Studies: Ct Head Wo Contrast  Result Date: 11/20/2016 CLINICAL DATA:  Hip replacement 5 days ago with recent syncopal episode and dizziness. EXAM: CT HEAD WITHOUT CONTRAST TECHNIQUE: Contiguous axial images were obtained from the base of the skull through the vertex without intravenous contrast. COMPARISON:  None. FINDINGS: Brain: Ventricles, cisterns and other CSF spaces are within normal. There is no mass, mass effect, shift of midline structures or acute hemorrhage. There is no evidence of acute infarction. Vascular: Minimal calcified plaque over the cavernous segment of the internal carotid arteries. Skull: Within normal. Sinuses/Orbits: Within normal. Other: None. IMPRESSION: No acute intracranial findings. Electronically Signed   By: Elberta Fortisaniel  Boyle M.D.   On: 11/20/2016 15:56        Scheduled Meds: . docusate sodium  100 mg Oral BID  . iron polysaccharides  150 mg Oral Daily  . levothyroxine  50 mcg Oral QAC breakfast  . rivaroxaban  10 mg Oral Q breakfast  . sertraline  100 mg Oral Daily  . simvastatin  40 mg Oral QPM   Continuous Infusions: . sodium chloride Stopped (11/16/16 1006)  . sodium chloride 100 mL/hr at 11/20/16 2104     LOS: 6 days    Time spent: 35  mins    Mahala Rommel, MD [facility] Pager [phone]  If 7PM-7AM, please contact night-coverage [email or web address] Password [email or web address] 11/21/2016, 12:26 PM

## 2016-11-21 NOTE — Progress Notes (Signed)
Pt will DC to son's home [address] [place], [place] [address]. Pt will receive Wheelchair prior to discharging today. CM has updated [facility] rep, tim on pt's address.  No other CM needs were communicated.

## 2016-11-21 NOTE — Progress Notes (Signed)
   Subjective: 6 Days Post-Op Procedure(s) (LRB): RIGHT TOTAL HIP ARTHROPLASTY ANTERIOR APPROACH (Right) Patient reports pain as mild.   Patient seen in rounds for Dr. Lequita HaltAluisio. Patient is well, and has had no acute complaints or problems Patient is ready to go home if she does okay and is safe with therapy and no symptoms.  Objective: Vital signs in last 24 hours: Temp:  [97.5 F (36.4 C)-98.8 F (37.1 C)] 98.6 F (37 C) (01/16 0622) Pulse Rate:  [49-72] 72 (01/16 0622) Resp:  [16-18] 16 (01/16 0622) BP: (64-129)/(35-63) 116/63 (01/16 0622) SpO2:  [93 %-99 %] 95 % (01/16 0622)  Intake/Output from previous day:  Intake/Output Summary (Last 24 hours) at 11/21/16 0855 Last data filed at 11/21/16 [id number]  Gross per 24 hour  Intake             2635 ml  Output                0 ml  Net             2635 ml    Intake/Output this shift: No intake/output data recorded.  Labs:  Recent Labs  11/20/16 0708  HGB 9.3*    Recent Labs  11/20/16 0708  WBC 4.2  RBC 3.10*  HCT 27.5*  PLT 214    Recent Labs  11/20/16 0708  NA 139  K 3.8  CL 103  CO2 30  BUN 13  CREATININE 0.63  GLUCOSE 115*  CALCIUM 8.7*   No results for input(s): LABPT, INR in the last 72 hours.  EXAM: General - Patient is Alert, Appropriate and Oriented Extremity - Neurovascular intact Sensation intact distally Incision - clean, dry Motor Function - intact, moving foot and toes well on exam.   Assessment/Plan: 6 Days Post-Op Procedure(s) (LRB): RIGHT TOTAL HIP ARTHROPLASTY ANTERIOR APPROACH (Right) Procedure(s) (LRB): RIGHT TOTAL HIP ARTHROPLASTY ANTERIOR APPROACH (Right) Past Medical History:  Diagnosis Date  . Abnormal EKG   . Anemia   . Anxiety   . Arthritis   . Depression   . Heart murmur    mentions possible asymptomatic heart murumur years ago  . Hyperlipidemia   . Hypothyroidism   . Insomnia 04/27/2014  . Osteopenia   . Snoring 04/27/2014   Principal Problem:   OA  (osteoarthritis) of hip  Estimated body mass index is 22.47 kg/m as calculated from the following:   Height as of this encounter: 5\' 5"  (1.651 m).   Weight as of this encounter: 61.2 kg (135 lb). Up with therapy Discharge home with home health Diet - Cardiac diet Follow up - in 2 weeks Activity - WBAT Disposition - Home Condition Upon Discharge - pending D/C Meds - See DC Summary DVT Prophylaxis - Xarelto  Avel Peacerew Quetzally Callas, PA-C Orthopaedic Surgery 11/21/2016, 8:55 AM

## 2016-11-21 NOTE — Progress Notes (Signed)
Physical Therapy Treatment Patient Details Name: Kaylee Hull MRN: [id number] DOB: 1946/10/31 Today's Date: 11/21/2016    History of Present Illness Pt s/p R THR s/p hypotension/dehydration with near syncope episodes    PT Comments    POD # 6 Pt feeling much better and physically appears better.  Assisted with amb a greater distance in hallway with NO c/o dizziness/queezie.  Practiced stairs and performed well. Pt plans to go to her son's home.    Follow Up Recommendations  Home health PT;Supervision for mobility/OOB (going to her Son's home)     Equipment Recommendations  Rolling walker with 5" wheels    Recommendations for Other Services       Precautions / Restrictions Precautions Precautions: Fall Restrictions Weight Bearing Restrictions: No Other Position/Activity Restrictions: WBAT    Mobility  Bed Mobility Overal bed mobility: Needs Assistance Bed Mobility: Supine to Sit     Supine to sit: Supervision     General bed mobility comments: OOB in recliner  Transfers Overall transfer level: Needs assistance Equipment used: Rolling walker (2 wheeled) Transfers: Sit to/from [employer] Sit to Stand: Supervision Stand pivot transfers: Supervision       General transfer comment: only one VC for safety with turns using walker in a tight space (bathroom)   Ambulation/Gait Ambulation/Gait assistance: Supervision Ambulation Distance (Feet): 65 Feet Assistive device: Rolling walker (2 wheeled) Gait Pattern/deviations: Step-to pattern;Step-through pattern     General Gait Details: NO c/o feeling dizzy or queezie.  Much improved.  Tolerated increased distance and demonstarted ni LOB.  Safety cognition good.  Use of walker good.    Stairs Stairs: Yes   Stair Management: No rails;Step to pattern;Backwards Number of Stairs: 4 General stair comments: up backward due to no rails.  Tolerated well and retained instructions well.  Handout also  given.   Wheelchair Mobility    Modified Rankin (Stroke Patients Only)       Balance Overall balance assessment: Needs assistance         Standing balance support: Bilateral upper extremity supported Standing balance-Leahy Scale: Poor                      Cognition Arousal/Alertness: Awake/alert Behavior During Therapy: WFL for tasks assessed/performed Overall Cognitive Status: Within Functional Limits for tasks assessed                      Exercises      General Comments        Pertinent Vitals/Pain Pain Assessment: Faces Faces Pain Scale: Hurts a little bit Pain Location: R hip Pain Descriptors / Indicators: Grimacing;Operative site guarding Pain Intervention(s): Monitored during session;Repositioned;Ice applied    Home Living                      Prior Function            PT Goals (current goals can now be found in the care plan section) Acute Rehab PT Goals Patient Stated Goal: to go home and feel better! Progress towards PT goals: Progressing toward goals    Frequency    7X/week      PT Plan Current plan remains appropriate    Co-evaluation             End of Session Equipment Utilized During Treatment: Gait belt Activity Tolerance: Patient tolerated treatment well Patient left: in chair;with call bell/phone within reach;with chair alarm set;with family/visitor present  Time: 1610-96041325-1355 PT Time Calculation (min) (ACUTE ONLY): 30 min  Charges:  $Gait Training: 8-22 mins $Therapeutic Activity: 8-22 mins                    G Codes:      Felecia ShellingLori Leeam Cedrone  PTA [facility]  Acute  Rehab Pager      [phone]

## 2016-11-21 NOTE — Progress Notes (Signed)
Occupational Therapy Treatment Patient Details Name: Kaylee Hull MRN: [id number] DOB: 1946-08-01 Today's Date: 11/21/2016    History of present illness Pt s/p R THR   OT comments  Pt much improved today.  She was able to perform ADLs with min guard assist and ambulated to BR with min guard assist.  Pt is guarded with movement and moves slowly, but BP remained stable throughout.  She will likely not have 24 hour assist at discharge, but wishes to discharge home.  She will likely discharge to son's home, who can be available as needed.  Recommend use of w/c when family not present, and limit activity to transfers only, until cleared by [facility] for independence - pt verbalizes understanding   Follow Up Recommendations  Supervision/Assistance - 24 hour;Home health OT    Equipment Recommendations  3 in 1 bedside commode    Recommendations for Other Services      Precautions / Restrictions Precautions Precautions: Fall Restrictions Other Position/Activity Restrictions: WBAT       Mobility Bed Mobility Overal bed mobility: Needs Assistance Bed Mobility: Supine to Sit     Supine to sit: Supervision     General bed mobility comments: HOB flat and bedrails removed.  Pt requires increased time to complete.  She hooked Lt LE under Rt ankle to assist Rt LEs    Transfers Overall transfer level: Needs assistance Equipment used: Rolling walker (2 wheeled) Transfers: Sit to/from [employer] Sit to Stand: Min guard Stand pivot transfers: Min guard            Balance Overall balance assessment: Needs assistance         Standing balance support: Bilateral upper extremity supported Standing balance-Leahy Scale: Poor                     ADL Overall ADL's : Needs assistance/impaired                     Lower Body Dressing: Min guard;Sit to/from stand Lower Body Dressing Details (indicate cue type and reason): Pt was able to demonstrate correct  [place] of AE during ADLs  Toilet Transfer: Min guard;Ambulation;Comfort height toilet;BSC;RW Toilet Transfer Details (indicate cue type and reason): Pt guarded with activity, but reports feeling better this today  Toileting- Clothing Manipulation and Hygiene: Min guard;Sit to/from stand       Functional mobility during ADLs: Rolling walker;Min guard General ADL Comments: Pt reports son would like her to discharge home to his home.  Son will be working during the day so pt will be alone unless assistance is needed.   Discussed use of w/c when no one is home and limiting herself to transfers only until she has been cleared by [facility] to ambulate independently.         Vision                     Perception     Praxis      Cognition   Behavior During Therapy: [facility] for tasks assessed/performed Overall Cognitive Status: Within Functional Limits for tasks assessed                       Extremity/Trunk Assessment               Exercises     Shoulder Instructions       General Comments      Pertinent Vitals/ Pain  Pain Assessment: Faces Faces Pain Scale: Hurts little more Pain Location: R hip Pain Descriptors / Indicators: Grimacing;Guarding;Spasm Pain Intervention(s): Repositioned  Home Living                                          Prior Functioning/Environment              Frequency  Min 2X/week        Progress Toward Goals  OT Goals(current goals can now be found in the care plan section)  Progress towards OT goals: Progressing toward goals  Acute Rehab OT Goals Patient Stated Goal: to go home and feel better!  Plan Discharge plan needs to be updated    Co-evaluation                 End of Session Equipment Utilized During Treatment: Rolling walker;Gait belt   Activity Tolerance Patient tolerated treatment well   Patient Left in chair;with call bell/phone within reach   Nurse Communication Mobility  status        Time: 4540-98110910-1037 OT Time Calculation (min): 87 min  Charges: OT General Charges $OT Visit: 1 Procedure OT Treatments $Self Care/Home Management : 83-97 mins  Gervis Gaba M 11/21/2016, 12:52 PM

## 2017-01-04 ENCOUNTER — Encounter (INDEPENDENT_AMBULATORY_CARE_PROVIDER_SITE_OTHER): Payer: Self-pay | Admitting: Physical Medicine and Rehabilitation

## 2017-10-03 ENCOUNTER — Ambulatory Visit: Payer: Medicare Other | Attending: Internal Medicine | Admitting: Physical Therapy

## 2017-10-03 ENCOUNTER — Encounter: Payer: Self-pay | Admitting: Physical Therapy

## 2017-10-03 DIAGNOSIS — H8111 Benign paroxysmal vertigo, right ear: Secondary | ICD-10-CM | POA: Diagnosis not present

## 2017-10-03 NOTE — Therapy (Signed)
[facility] [address] [place], [place], [address] Phone: [phone]   Fax:  [phone]  Physical Therapy Evaluation  Patient Details  Name: Kaylee Hull MRN: [id number] Date of Birth: [birth year] Referring Provider: Dr. Jarome Matinaniel Paterson   Encounter Date: 10/03/2017  PT End of Session - 10/03/17 2103    Visit Number  1    Number of Visits  1    Date for PT Re-Evaluation  11/02/17    Authorization Type  [employer] Medicare    PT Start Time  1210    PT Stop Time  1248    PT Time Calculation (min)  38 min       Past Medical History:  Diagnosis Date  . Abnormal EKG   . Anemia   . Anxiety   . Arthritis   . Depression   . Heart murmur    mentions possible asymptomatic heart murumur years ago  . Hyperlipidemia   . Hypothyroidism   . Hypothyroidism 11/21/2016  . Insomnia 04/27/2014  . Osteopenia   . Snoring 04/27/2014    Past Surgical History:  Procedure Laterality Date  . COLONOSCOPY  12/08   several polyps removed  . laser throat  1992  . TONSILLECTOMY     age 89  . TOTAL HIP ARTHROPLASTY Right 11/15/2016   Procedure: RIGHT TOTAL HIP ARTHROPLASTY ANTERIOR APPROACH;  Surgeon: Ollen GrossFrank Aluisio, MD;  Location: [facility];  Service: Orthopedics;  Laterality: Right;  . 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   Subjective Assessment - 10/03/17 2045    Subjective  Pt reports she had Rt THR on 11-15-16; states she has had intermittent vertigo since this surgery; reports she gets dizzy when she looks down and turns over in bed    Pertinent History  Rt THR 11-15-16    Patient Stated Goals  resolve the vertigo    Currently in Pain?  No/denies         [facility] PT Assessment - 10/03/17 1229      Assessment   Medical Diagnosis  Vertigo    Referring Provider  Dr. Jarome Matinaniel Paterson    Onset Date/Surgical Date  11/15/16      Precautions   Precautions  Other (comment) vertigo intermittent in  occurrence      Balance Screen   Has the patient fallen in the past 6 months  No    Has the patient had a decrease in activity level because of a fear of falling?   No    Is the patient reluctant to leave their home because of a fear of falling?   No         Vestibular Assessment - 10/03/17 0001      Vestibular Assessment   General Observation  pt is a 71 yr old lady with c/o intermittent vertigo that started after her Rt THR in Jan 2018      Symptom Behavior   Type of Dizziness  Spinning    Frequency of Dizziness  varies    Duration of Dizziness  seconds    Aggravating Factors  Mornings;Rolling to right;Forward bending    Relieving Factors  Head stationary      Occulomotor Exam   Occulomotor Alignment  Normal    Spontaneous  Absent    Smooth Pursuits  Intact      Positional Testing   Dix-Hallpike  Dix-Hallpike Right;Dix-Hallpike Left      Dix-Hallpike Right  Dix-Hallpike Right Duration  12 secs    Dix-Hallpike Right Symptoms  Upbeat, right rotatory nystagmus      Dix-Hallpike Left   Dix-Hallpike Left Duration  none    Dix-Hallpike Left Symptoms  No nystagmus         Objective measurements completed on examination: See above findings.       Epley maneuver performed 3 reps for Rt BPPV - symptoms resolved on 3rd rep            PT Long Term Goals - 10/03/17 2110      PT LONG TERM GOAL #1   Title  N/A - symptoms appear to be resolved as of today's date (10-03-17)             Plan - 10/03/17 2107    Clinical Impression Statement  Pt is a 71 yr old lady with (+) Rt Dix-Hallpike test with rotary nystagmus, indicative of Rt BPPV;  symptoms resolved on 3rd rep of Epley maneuver    History and Personal Factors relevant to plan of care:  Rt THR on 11-15-16 with c/o intermittent vertigo since this sx    Clinical Presentation  Stable    Clinical Presentation due to:  Rt BPPV    Clinical Decision Making  Low    Rehab Potential  Good    PT Frequency   One time visit    PT Treatment/Interventions  Vestibular    PT Next Visit Plan  HOLD for 30 days - will D/C if pt has no vertigo    Consulted and Agree with Plan of Care  Patient       Patient will benefit from skilled therapeutic intervention in order to improve the following deficits and impairments:  Dizziness  Visit Diagnosis: BPPV (benign paroxysmal positional vertigo), right - Plan: PT plan of care cert/re-cert  G-Codes - 10/03/17 2112    Functional Assessment Tool Used (Outpatient Only)  (+) Rt Dix-Hallpike indicative of Rt BPPV    Functional Limitation  Changing and maintaining body position    Changing and Maintaining Body Position Current Status (Z6109(G8981)  At least 1 percent but less than 20 percent impaired, limited or restricted    Changing and Maintaining Body Position Goal Status (U0454(G8982)  At least 1 percent but less than 20 percent impaired, limited or restricted    Changing and Maintaining Body Position Discharge Status (U9811(G8983)  At least 1 percent but less than 20 percent impaired, limited or restricted        Problem List Patient Active Problem List   Diagnosis Date Noted  . Orthostasis 11/21/2016  . Hypothyroidism 11/21/2016  . Hyperlipidemia 11/21/2016  . Depression 11/21/2016  . OA (osteoarthritis) of hip 11/15/2016  . Insomnia 04/27/2014  . Snoring 04/27/2014    Kary Kosilday, Leary Mcnulty Suzanne, PT 10/03/2017, 9:16 PM  [facility] [address] [place], [place], [address] Phone: [phone]   Fax:  [phone]  Name: Kaylee Hull MRN: [id number] Date of Birth: [birth year]

## 2018-11-05 IMAGING — MR MR LUMBAR SPINE W/O CM
5 series · 44 of 48 positions shown · non-contrast
Comparison: [DATE] abdominal CT

CLINICAL DATA: Chronic low back pain without sciatica.

EXAM:
MRI LUMBAR SPINE WITHOUT CONTRAST
TECHNIQUE: Multiplanar, multisequence MR imaging of the lumbar spine was
performed. No intravenous contrast was administered.

[Series 3: T2 · sagittal · 4.0mm · 0.94mm/px · 7 of 16 slices shown (1 of 2)]
[im 1/16]
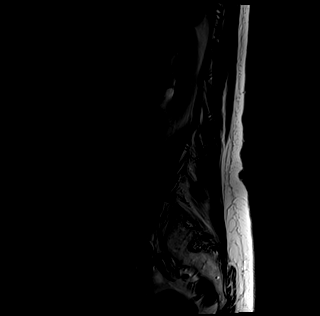
[im 3/16]
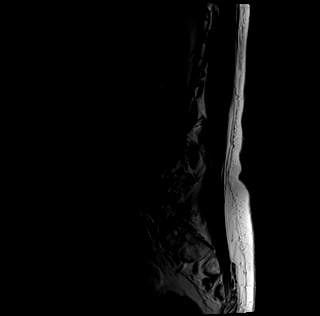
[im 6/16]
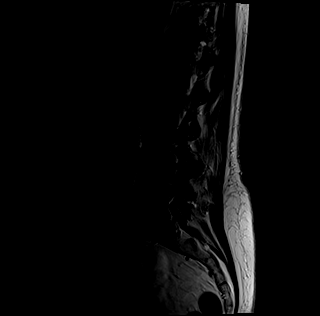
[im 8/16]
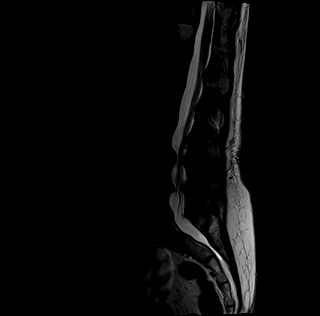
[im 11/16]
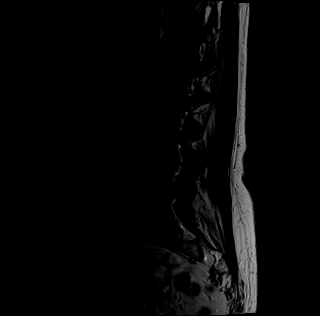
[im 13/16]
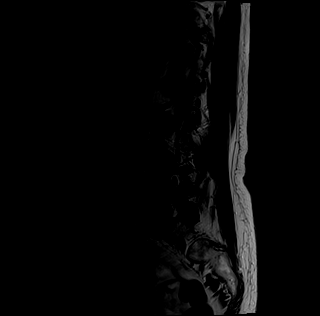
[im 16/16]
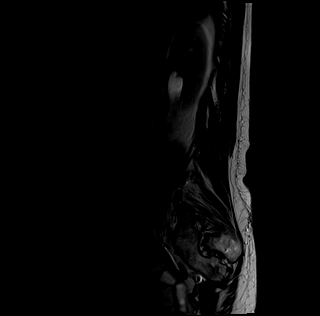

[Series 4: T1 · sagittal · 4.0mm · 0.94mm/px · 7 of 16 slices shown (1 of 2)]
[im 1/16]
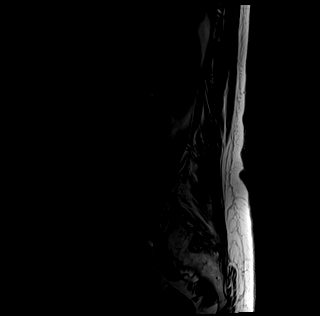
[im 3/16]
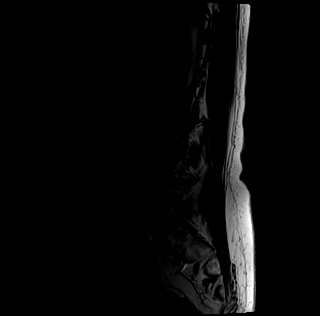
[im 6/16]
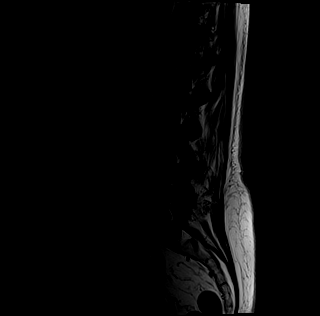
[im 8/16]
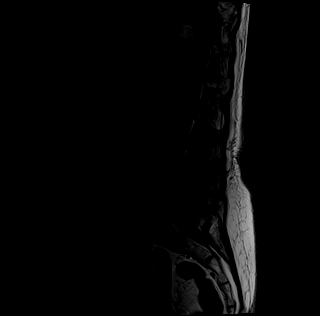
[im 11/16]
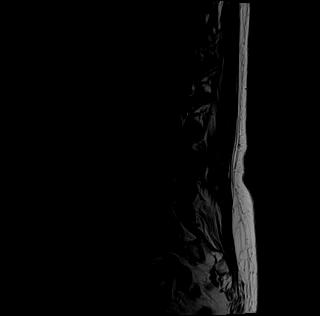
[im 13/16]
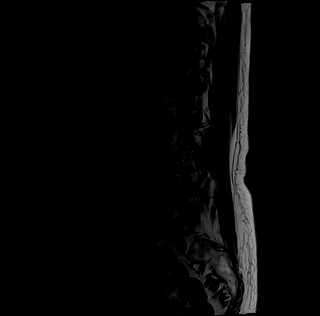
[im 16/16]
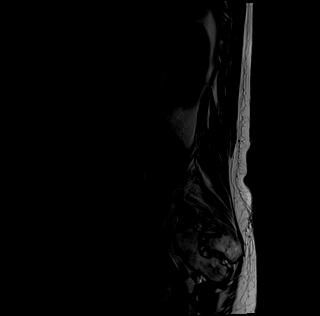

[Series 5: tirm sag · sagittal · 4.0mm · 0.59mm/px · 6 of 16 slices shown]
[im 1/16]
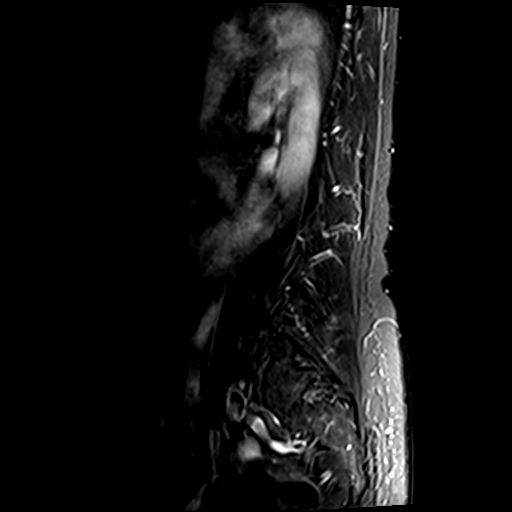
[im 4/16]
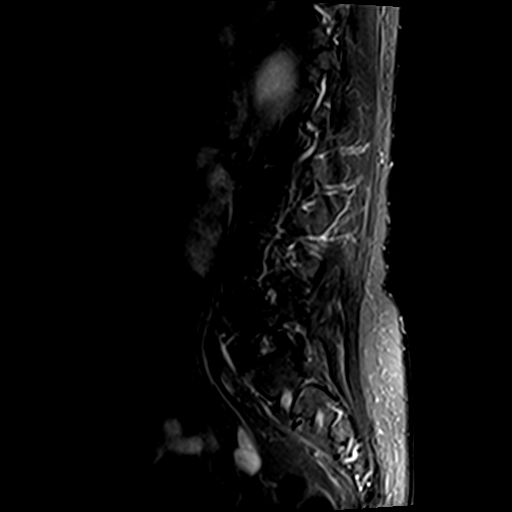
[im 7/16]
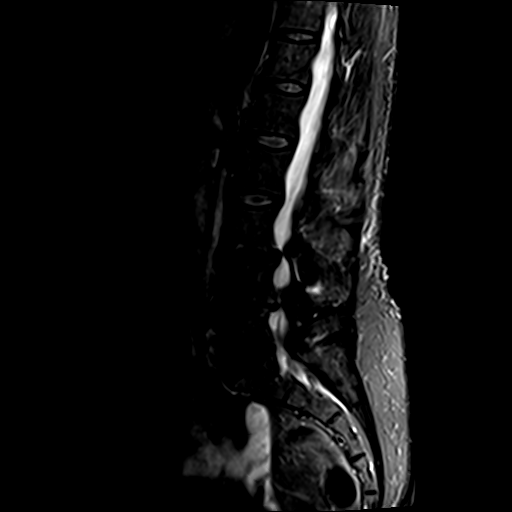
[im 10/16]
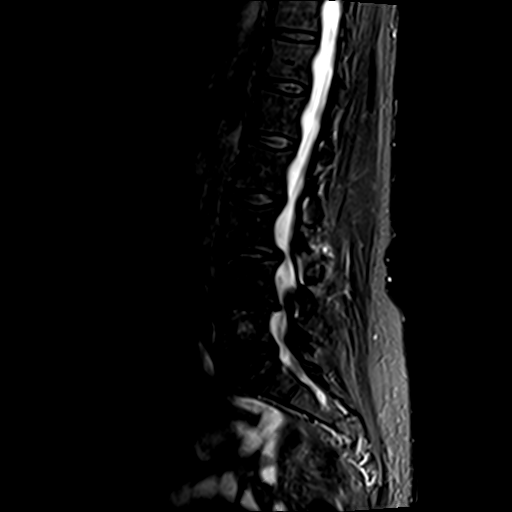
[im 13/16]
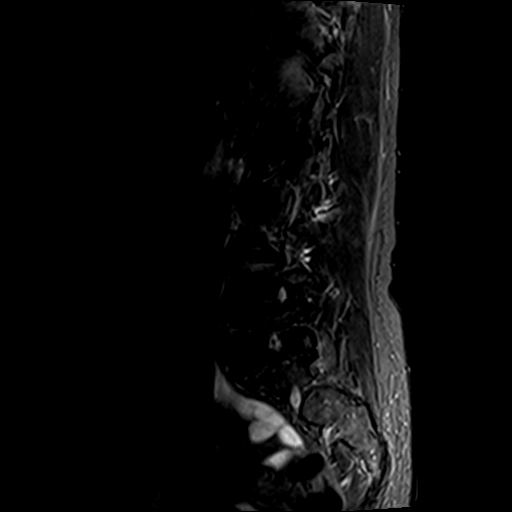
[im 16/16]
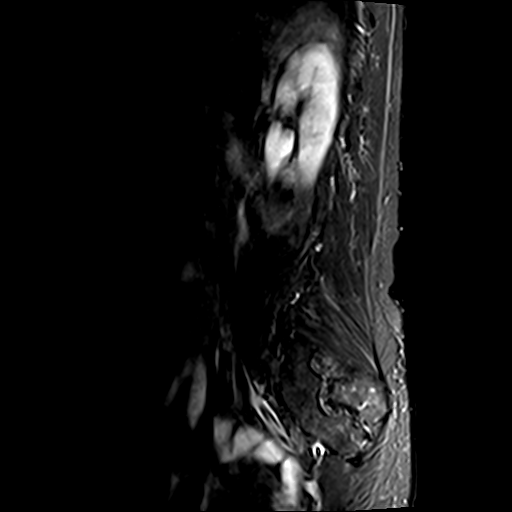

[Series 7: T2 · axial · 4.0mm · 0.78mm/px · z∈[-66,+121]mm · 14 of 36 slices shown (2 of 2)]
[im 1/36]
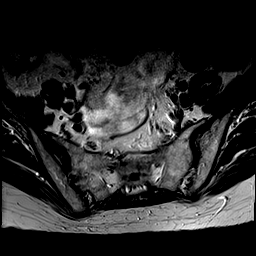
[im 3/36]
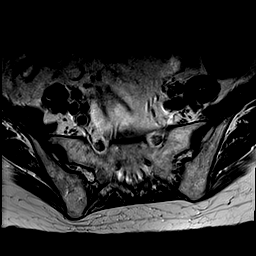
[im 6/36]
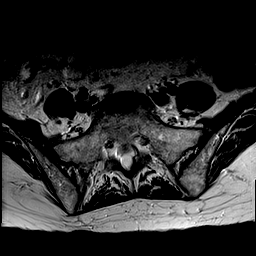
[im 9/36]
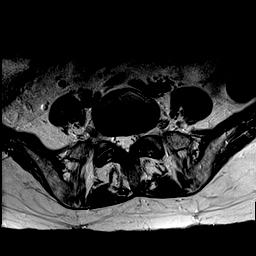
[im 11/36]
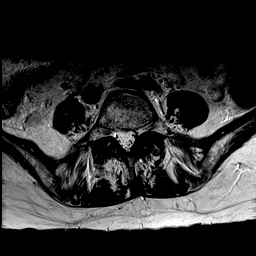
[im 14/36]
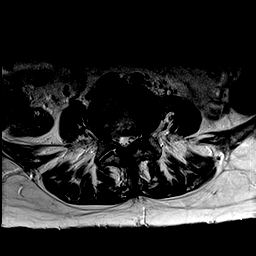
[im 17/36]
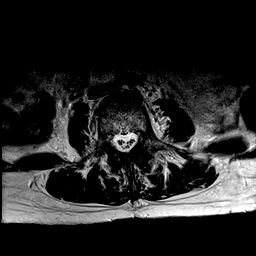
[im 19/36]
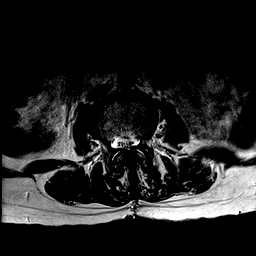
[im 22/36]
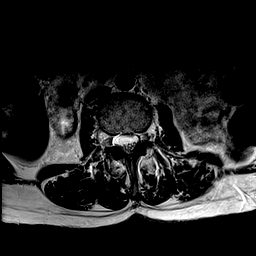
[im 25/36]
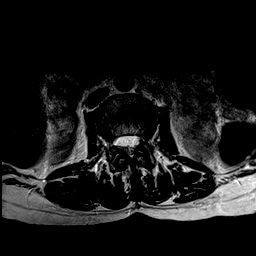
[im 27/36]
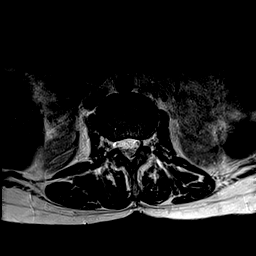
[im 30/36]
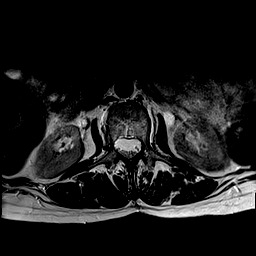
[im 33/36]
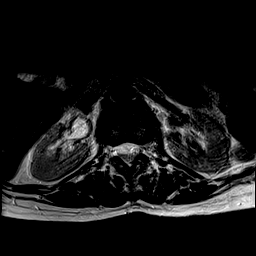
[im 36/36]
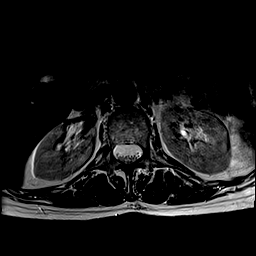

[Series 8: T1 · axial · 4.0mm · 0.78mm/px · z∈[-66,+121]mm · 10 of 36 slices shown (2 of 2)]
[im 1/36]
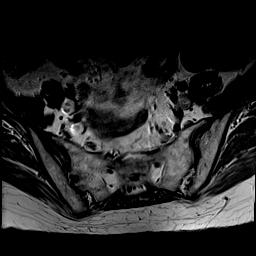
[im 3/36]
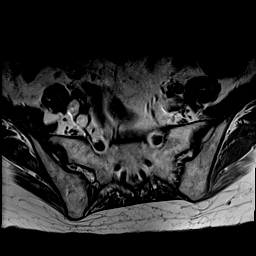
[im 6/36]
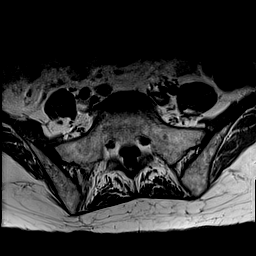
[im 9/36]
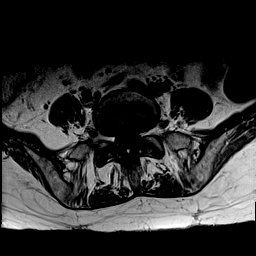
[im 11/36]
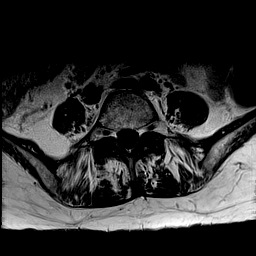
[im 17/36]
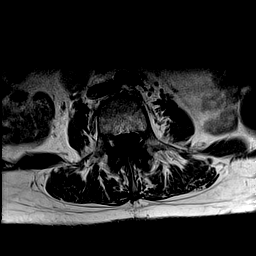
[im 19/36]
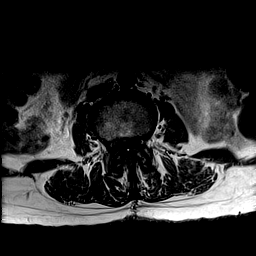
[im 25/36]
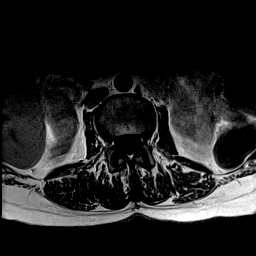
[im 30/36]
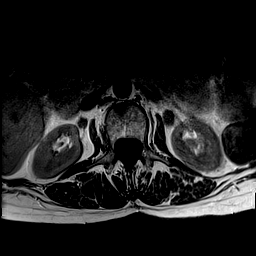
[im 36/36]
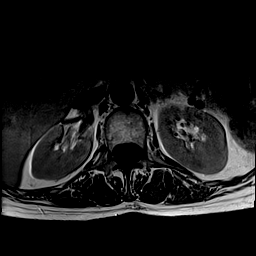

[44 of 48 positions shown; findings below may reference images not displayed]

FINDINGS: Segmentation:  Standard.

Alignment:  Facet mediated L3-4 grade 1 anterolisthesis.

Vertebrae: No fracture, evidence of discitis, or aggressive bone
lesion. Incidental T12 hemangioma

Conus medullaris: Extends to the T12-L1 level and appears normal.

Paraspinal and other soft tissues: Negative

Disc levels:

T12- L1: Unremarkable.

L1-L2: Unremarkable.

L2-L3: Mild facet arthropathy with spurring. No herniation or
impingement

L3-L4: Advanced facet arthropathy with spurring and joint
distortion. Grade 1 anterolisthesis. The uncovered disc is bulging.
Moderate spinal stenosis. Patent foramina.

L4-L5: Advanced disc narrowing endplate ridging. Facet arthropathy
with spurring and ligamentum flavum thickening. Moderate spinal
stenosis. Patent foramina

L5-S1:Mild degenerative facet spurring. Minimal annulus bulging. No
impingement
IMPRESSION: Advanced disc degeneration and facet arthropathy at L3-4 and L4-5
with moderate spinal stenosis. Facet arthropathy is greater at L3-4
where there is grade 1 anterolisthesis. No foraminal impingement.

## 2020-03-25 ENCOUNTER — Ambulatory Visit: Payer: Medicare Other | Attending: Internal Medicine

## 2020-03-25 DIAGNOSIS — Z23 Encounter for immunization: Secondary | ICD-10-CM

## 2020-03-25 NOTE — Progress Notes (Signed)
   Covid-19 Vaccination Clinic  Name:  Kaylee Hull    MRN: [id number] DOB: 01-23-1946  03/25/2020  [name] was observed post Covid-19 immunization for 15 minutes without incident. She was provided with Vaccine Information Sheet and instruction to access the V-Safe system.   [name] was instructed to call 911 with any severe reactions post vaccine: Marland Kitchen Difficulty breathing  . Swelling of face and throat  . A fast heartbeat  . A bad rash all over body  . Dizziness and weakness   Immunizations Administered    Name Date Dose VIS Date Route   Pfizer COVID-19 Vaccine 03/25/2020 10:09 AM 0.3 mL 12/31/2018 Intramuscular   Manufacturer: [employer], [employer]   Lot: [id number]   NDC: [address]-2

## 2020-04-19 ENCOUNTER — Ambulatory Visit: Payer: Medicare Other | Attending: Internal Medicine

## 2020-04-19 DIAGNOSIS — Z23 Encounter for immunization: Secondary | ICD-10-CM

## 2020-04-19 NOTE — Progress Notes (Signed)
° °  Covid-19 Vaccination Clinic  Name:  Kaylee Hull    MRN: [id number] DOB: 1946-03-28  04/19/2020  [name] was observed post Covid-19 immunization for 15 minutes without incident. She was provided with Vaccine Information Sheet and instruction to access the V-Safe system.   [name] was instructed to call 911 with any severe reactions post vaccine:  Difficulty breathing   Swelling of face and throat   A fast heartbeat   A bad rash all over body   Dizziness and weakness   Immunizations Administered    Name Date Dose VIS Date Route   Pfizer COVID-19 Vaccine 04/19/2020  9:56 AM 0.3 mL 12/31/2018 Intramuscular   Manufacturer: [employer], [employer]   Lot: [id number]   NDC: [address]-1

## 2020-05-23 ENCOUNTER — Ambulatory Visit (INDEPENDENT_AMBULATORY_CARE_PROVIDER_SITE_OTHER): Payer: Medicare Other

## 2020-05-23 ENCOUNTER — Encounter (HOSPITAL_COMMUNITY): Payer: Self-pay

## 2020-05-23 ENCOUNTER — Ambulatory Visit (HOSPITAL_COMMUNITY)
Admission: EM | Admit: 2020-05-23 | Discharge: 2020-05-23 | Disposition: A | Discharge status: Home / Self Care | Payer: Medicare Other

## 2020-05-23 DIAGNOSIS — M79672 Pain in left foot: Secondary | ICD-10-CM

## 2020-05-23 DIAGNOSIS — S92355A Nondisplaced fracture of fifth metatarsal bone, left foot, initial encounter for closed fracture: Secondary | ICD-10-CM | POA: Diagnosis not present

## 2020-05-23 NOTE — ED Provider Notes (Signed)
[facility]    CSN: [id number] Arrival date & time: 05/23/20  1116      History   Chief Complaint Chief Complaint  Patient presents with  . Foot Pain    HPI Kaylee Hull is a 73 y.o. female. She presents with a left foot pain. Reports getting out of the car at church and stepped over 2 cats. Her left ankle rolled laterally and she acutely developed pain in her lateral foot. Denies bruising or swelling yet.     HPI  Past Medical History:  Diagnosis Date  . Abnormal EKG   . Anemia   . Anxiety   . Arthritis   . Depression   . Heart murmur    mentions possible asymptomatic heart murumur years ago  . Hyperlipidemia   . Hypothyroidism   . Hypothyroidism 11/21/2016  . Insomnia 04/27/2014  . Osteopenia   . Snoring 04/27/2014    Patient Active Problem List   Diagnosis Date Noted  . Orthostasis 11/21/2016  . Hypothyroidism 11/21/2016  . Hyperlipidemia 11/21/2016  . Depression 11/21/2016  . OA (osteoarthritis) of hip 11/15/2016  . Insomnia 04/27/2014  . Snoring 04/27/2014    Past Surgical History:  Procedure Laterality Date  . COLONOSCOPY  12/08   several polyps removed  . laser throat  1992  . TONSILLECTOMY     age 66  . 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 ulnar neuropathy Left 05/01    OB History   No obstetric history on file.      Home Medications    Prior to Admission medications   Medication Sig Start Date End Date Taking? Authorizing Provider  ergocalciferol (VITAMIN D2) 1.25 MG (50000 UT) capsule Take 50,000 Units by mouth once a week.   Yes [provider]  levothyroxine (SYNTHROID, LEVOTHROID) 50 MCG tablet Take 50 mcg by mouth daily before breakfast.  04/23/14   [provider]  sertraline (ZOLOFT) 50 MG tablet Take 100 mg by mouth daily. 09/11/16   [provider]  simvastatin (ZOCOR) 40 MG tablet Take 40 mg by mouth every evening.  04/23/14   [provider]  zolpidem (AMBIEN) 10 MG tablet Take 5 mg by mouth at bedtime as needed for sleep.     [provider]    Family History Family History  Problem Relation Age of Onset  . Liver cancer Father   . Diabetes Mother   . Coronary artery disease Mother   . Lung cancer Mother     Social History Social History   Tobacco Use  . Smoking status: Former Smoker    Quit date: 05/13/1991    Years since quitting: 29.0  . Smokeless tobacco: Never Used  Substance Use Topics  . Alcohol use: No  . Drug use: No     Allergies   Other and Sulfa antibiotics   Review of Systems Review of Systems  Musculoskeletal: Positive for gait problem.       Foot pain   All other systems reviewed and are negative.    Physical Exam Triage Vital Signs ED Triage Vitals  Enc Vitals Group     BP 05/23/20 1139 136/65     Pulse Rate 05/23/20 1139 60     Resp 05/23/20 1139 16     Temp 05/23/20 1139 98.2 F (36.8 C)  Temp src --      SpO2 05/23/20 1139 98 %     Weight --      Height --      Head Circumference --      Peak Flow --      Pain Score 05/23/20 1136 2     Pain Loc --      Pain Edu? --      Excl. in GC? --    No data found.  Updated Vital Signs BP 136/65   Pulse 60   Temp 98.2 F (36.8 C)   Resp 16   SpO2 98%   Visual Acuity Right Eye Distance:   Left Eye Distance:   Bilateral Distance:    Right Eye Near:   Left Eye Near:    Bilateral Near:     Physical Exam Vitals reviewed.  Constitutional:      General: She is not in acute distress.    Appearance: Normal appearance. She is normal weight.  HENT:     Head: Normocephalic.     Right Ear: External ear normal.     Left Ear: External ear normal.     Nose: Nose normal.  Eyes:     General: No scleral icterus.    Conjunctiva/sclera: Conjunctivae normal.  Cardiovascular:     Pulses: Normal  pulses.  Pulmonary:     Effort: Pulmonary effort is normal. No respiratory distress.  Musculoskeletal:     Right foot: Normal range of motion. No deformity.     Left foot: Normal range of motion and normal capillary refill. Tenderness present. No deformity. Normal pulse.       Feet:  Feet:     Left foot:     Skin integrity: No erythema.  Neurological:     Mental Status: She is alert.      UC Treatments / Results  Labs (all labs ordered are listed, but only abnormal results are displayed) Labs Reviewed - No data to display  EKG   Radiology No results found.  Procedures Procedures (including critical care time)  Medications Ordered in UC Medications - No data to display  Initial Impression / Assessment and Plan / UC Course  I have reviewed the triage vital signs and the nursing notes.  Pertinent labs & imaging results that were available during my care of the patient were reviewed by me and considered in my medical decision making (see chart for details).     5th metatarsal fracture. Placed in a cam walker boot. She will follow-up in 1-2 weeks with her Orthopedics MD, Dr. Aundria Rud  Final Clinical Impressions(s) / UC Diagnoses   Final diagnoses:  Closed nondisplaced fracture of fifth metatarsal bone of left foot, initial encounter     Discharge Instructions     Please wear the boot for support. Follow-up with Dr. Aundria Rud. Take Ibuprofen and tylenol for pain as needed    ED Prescriptions    None     PDMP not reviewed this encounter.   Chilton Si, PA-C 05/23/20 1250

## 2020-05-23 NOTE — Discharge Instructions (Addendum)
Please wear the boot for support. Follow-up with Dr. Aundria Rud. Take Ibuprofen and tylenol for pain as needed

## 2020-05-23 NOTE — ED Notes (Signed)
Pt states she tripped this morning, turning her left foot laterally. C/o pain to lateral aspect of left foot. Edema noted with limited ROM Foot warm to touch, brisk cap refill +2 DP pulse.

## 2020-05-23 NOTE — ED Triage Notes (Signed)
Pt c/o left foot pain after tripping over cat outdoors, pt did not fall, did not hit head

## 2021-03-25 ENCOUNTER — Other Ambulatory Visit: Payer: Self-pay | Admitting: Internal Medicine

## 2021-03-25 DIAGNOSIS — M545 Low back pain, unspecified: Secondary | ICD-10-CM

## 2021-03-30 ENCOUNTER — Ambulatory Visit
Admission: RE | Admit: 2021-03-30 | Discharge: 2021-03-30 | Disposition: A | Discharge status: Home / Self Care | Payer: Medicare Other | Source: Ambulatory Visit | Attending: Internal Medicine | Admitting: Internal Medicine

## 2021-03-30 ENCOUNTER — Other Ambulatory Visit: Payer: Self-pay

## 2021-03-30 DIAGNOSIS — M545 Low back pain, unspecified: Secondary | ICD-10-CM

## 2023-11-21 DIAGNOSIS — Z1231 Encounter for screening mammogram for malignant neoplasm of breast: Secondary | ICD-10-CM | POA: Diagnosis not present

## 2024-02-04 DIAGNOSIS — E785 Hyperlipidemia, unspecified: Secondary | ICD-10-CM | POA: Diagnosis not present

## 2024-02-04 DIAGNOSIS — E039 Hypothyroidism, unspecified: Secondary | ICD-10-CM | POA: Diagnosis not present

## 2024-02-04 DIAGNOSIS — D649 Anemia, unspecified: Secondary | ICD-10-CM | POA: Diagnosis not present

## 2024-02-04 DIAGNOSIS — Z1212 Encounter for screening for malignant neoplasm of rectum: Secondary | ICD-10-CM | POA: Diagnosis not present

## 2024-02-04 DIAGNOSIS — M85851 Other specified disorders of bone density and structure, right thigh: Secondary | ICD-10-CM | POA: Diagnosis not present

## 2024-02-04 DIAGNOSIS — M85852 Other specified disorders of bone density and structure, left thigh: Secondary | ICD-10-CM | POA: Diagnosis not present

## 2024-02-11 DIAGNOSIS — R82998 Other abnormal findings in urine: Secondary | ICD-10-CM | POA: Diagnosis not present

## 2024-06-01 ENCOUNTER — Other Ambulatory Visit: Payer: Self-pay

## 2024-06-01 ENCOUNTER — Ambulatory Visit
Admission: EM | Admit: 2024-06-01 | Discharge: 2024-06-01 | Disposition: A | Discharge status: Home / Self Care | Attending: Physician Assistant | Admitting: Physician Assistant

## 2024-06-01 DIAGNOSIS — J0140 Acute pansinusitis, unspecified: Secondary | ICD-10-CM

## 2024-06-01 MED ORDER — AMOXICILLIN-POT CLAVULANATE 875-125 MG PO TABS: 1.0000 | ORAL_TABLET | Freq: Two times a day (BID) | ORAL | 0 refills | Status: AC

## 2024-06-01 NOTE — ED Triage Notes (Addendum)
 Pt presents with a chief complaint of cough x 1.5 weeks. Has been taking OTC Mucinex twice per day for one week. Also mentioning sinus pressure, headaches, and fatigue. Cough has gotten worse. Pt currently rates overall pain a 2/10. Little SOB.

## 2024-06-01 NOTE — ED Provider Notes (Signed)
 [facility]    CSN: [id number] Arrival date & time: 06/01/24  1245      History   Chief Complaint Chief Complaint  Patient presents with   Cough    HPI Kaylee Hull is a 78 y.o. female.   HPI   Pt reports that she has been having productive coughing, nasal congestion, voice change She states this has been ongoing for almost 2 weeks and her symptoms seem to be worsening  She reports ear pain, sore throat, teeth pain and headache. She states the headache and teeth pain are more prevalent when she leans forward  She denies similar symptoms in recent contacts or recent travel Interventions: She was taking allergy medication - allegra, last Thurs she started taking Mucinex BID    Past Medical History:  Diagnosis Date   Abnormal EKG    Anemia    Anxiety    Arthritis    Depression    Heart murmur    mentions possible asymptomatic heart murumur years ago   Hyperlipidemia    Hypothyroidism    Hypothyroidism 11/21/2016   Insomnia 04/27/2014   Osteopenia    Snoring 04/27/2014    Patient Active Problem List   Diagnosis Date Noted   Orthostasis 11/21/2016   Hypothyroidism 11/21/2016   Hyperlipidemia 11/21/2016   Depression 11/21/2016   OA (osteoarthritis) of hip 11/15/2016   Insomnia 04/27/2014   Snoring 04/27/2014    Past Surgical History:  Procedure Laterality Date   COLONOSCOPY  12/08   several polyps removed   laser throat  1992   TONSILLECTOMY     age 89   TOTAL HIP ARTHROPLASTY Right 11/15/2016   Procedure: RIGHT TOTAL HIP ARTHROPLASTY ANTERIOR APPROACH;  Surgeon: Dempsey Moan, MD;  Location: [facility];  Service: Orthopedics;  Laterality: Right;   ulnar neuropathy Left 05/01    OB History   No obstetric history on file.      Home Medications    Prior to Admission medications   Medication Sig Start Date End Date Taking? Authorizing Provider  amoxicillin -clavulanate (AUGMENTIN ) 875-125 MG tablet Take 1 tablet by mouth 2 (two) times  daily for 7 days. 06/01/24 06/08/24 Yes Makaiyah Schweiger E, PA-C  ergocalciferol (VITAMIN D2) 1.25 MG (50000 UT) capsule Take 50,000 Units by mouth once a week.    [provider]  levothyroxine  (SYNTHROID , LEVOTHROID) 50 MCG tablet Take 50 mcg by mouth daily before breakfast.  04/23/14   [provider]  sertraline  (ZOLOFT ) 50 MG tablet Take 100 mg by mouth daily. 09/11/16   [provider]  simvastatin  (ZOCOR ) 40 MG tablet Take 40 mg by mouth every evening.  04/23/14   [provider]  zolpidem  (AMBIEN ) 10 MG tablet Take 5 mg by mouth at bedtime as needed for sleep.     [provider]    Family History Family History  Problem Relation Age of Onset   Liver cancer Father    Diabetes Mother    Coronary artery disease Mother    Lung cancer Mother     Social History Social History   Tobacco Use   Smoking status: Former    Current packs/day: 0.00    Types: Cigarettes    Quit date: 05/13/1991    Years since quitting: 33.0   Smokeless tobacco: Never  Vaping Use   Vaping status: Never Used  Substance Use Topics   Alcohol use: No   Drug use: No     Allergies   Other and Sulfa  antibiotics   Review of Systems Review of Systems  Constitutional:  Positive for fatigue. Negative for chills and fever.  HENT:  Positive for congestion, ear pain, postnasal drip, rhinorrhea, sore throat and voice change.   Respiratory:  Positive for cough. Negative for shortness of breath and wheezing.   Gastrointestinal:  Negative for diarrhea, nausea and vomiting.  Musculoskeletal:  Negative for myalgias.  Neurological:  Positive for headaches.     Physical Exam Triage Vital Signs ED Triage Vitals  Encounter Vitals Group     BP 06/01/24 1258 137/75     Girls Systolic BP Percentile --      Girls Diastolic BP Percentile --      Boys Systolic BP Percentile --      Boys Diastolic BP Percentile --      Pulse Rate 06/01/24 1258 73     Resp 06/01/24 1258 18      Temp 06/01/24 1258 98.9 F (37.2 C)     Temp Source 06/01/24 1258 Oral     SpO2 06/01/24 1258 96 %     Weight 06/01/24 1258 128 lb (58.1 kg)     Height 06/01/24 1258 5' 4 (1.626 m)     Head Circumference --      Peak Flow --      Pain Score 06/01/24 1338 2     Pain Loc --      Pain Education --      Exclude from Growth Chart --    No data found.  Updated Vital Signs BP 137/75 (BP Location: Right Arm)   Pulse 73   Temp 98.9 F (37.2 C) (Oral)   Resp 18   Ht 5' 4 (1.626 m)   Wt 128 lb (58.1 kg)   SpO2 96%   BMI 21.97 kg/m   Visual Acuity Right Eye Distance:   Left Eye Distance:   Bilateral Distance:    Right Eye Near:   Left Eye Near:    Bilateral Near:     Physical Exam Vitals reviewed.  Constitutional:      General: She is awake. She is not in acute distress.    Appearance: Normal appearance. She is well-developed and well-groomed. She is not ill-appearing or toxic-appearing.  HENT:     Head: Normocephalic and atraumatic.     Right Ear: Hearing, tympanic membrane and ear canal normal.     Left Ear: Hearing, tympanic membrane and ear canal normal.     Mouth/Throat:     Lips: Pink.     Mouth: Mucous membranes are moist.     Pharynx: Oropharynx is clear. Uvula midline. No pharyngeal swelling, oropharyngeal exudate, posterior oropharyngeal erythema, uvula swelling or postnasal drip.  Neck:     Comments: Pt has hoarse voice   Cardiovascular:     Rate and Rhythm: Normal rate and regular rhythm.     Pulses: Normal pulses.          Radial pulses are 2+ on the right side and 2+ on the left side.     Heart sounds: Normal heart sounds. No murmur heard.    No friction rub. No gallop.  Pulmonary:     Effort: Pulmonary effort is normal.     Breath sounds: Normal breath sounds. Decreased air movement present. No decreased breath sounds, wheezing, rhonchi or rales.  Musculoskeletal:     Cervical back: Normal range of motion and neck supple.  Lymphadenopathy:      Head:     Right side  of head: No submental, submandibular or preauricular adenopathy.     Left side of head: No submental, submandibular or preauricular adenopathy.     Cervical:     Right cervical: No superficial cervical adenopathy.    Left cervical: No superficial cervical adenopathy.     Upper Body:     Right upper body: No supraclavicular adenopathy.     Left upper body: No supraclavicular adenopathy.  Neurological:     Mental Status: She is alert.  Psychiatric:        Behavior: Behavior is cooperative.      UC Treatments / Results  Labs (all labs ordered are listed, but only abnormal results are displayed) Labs Reviewed - No data to display  EKG   Radiology No results found.  Procedures Procedures (including critical care time)  Medications Ordered in UC Medications - No data to display  Initial Impression / Assessment and Plan / UC Course  I have reviewed the triage vital signs and the nursing notes.  Pertinent labs & imaging results that were available during my care of the patient were reviewed by me and considered in my medical decision making (see chart for details).     Pt declines prednisone taper and albuterol  inhaler to assist with wheezing and SOB  Final Clinical Impressions(s) / UC Diagnoses   Final diagnoses:  Acute pansinusitis, recurrence not specified   Patient presents today with concerns for productive coughing, nasal congestion, voice change and sore throat that is been ongoing for about 2 weeks and is not responding to over-the-counter medications.  Vitals are overall reassuring.  Physical exam does not reveal obvious signs of otitis media, pharyngeal erythema and is largely reassuring.  She does have some mildly decreased air movement as well as a hoarse voice.  At this time suspect sinusitis and will start Augmentin  p.o. twice daily x 7 days.  Discussed with patient that I can also send in a steroid to assist with breathing concerns but she  declines this as well as an albuterol  inhaler stating that she thinks her symptoms will improve once she is able to breathe through her nose.  Recommend continued use of over-the-counter medications as needed for further symptomatic relief.  ED and return precautions reviewed and provided in after visit summary.  Follow-up as needed for progressing or persistent symptoms   Discharge Instructions      Based on your symptoms and duration of illness, I believe you may have a bacterial sinus infection  These typically resolve with antibiotic therapy along with at-home comfort measures  Today I have sent in a prescription for  Augmentin  875-125 mg to be taken by mouth twice per day for 7 days FINISH THE ENTIRE COURSE unless you develop an allergic reaction or are instructed to discontinue.  It can take a few days for the antibiotic to kick in so I recommend symptomatic relief with over the counter medication such as the following: Dayquil/ Nyquil Theraflu Alkaseltzer   If you have high blood pressure I recommend that you take Mucinex, Robitussin, Tylenol  instead of the combination medications.  Combination medications typically have a decongestant in them that can cause your blood pressure to be high.  These medications typically have Tylenol  in them already so you do not need to supplement with more outside of those medications  Stay well hydrated with at least 75 oz of water per day to help with recovery  If at any point you start to develop swelling around the eyes  and nose, trouble seeing, fevers that are not responding to medications, trouble breathing, passing out or headaches that are very severe please go to the emergency room for further evaluation management      ED Prescriptions     Medication Sig Dispense Auth. Provider   amoxicillin -clavulanate (AUGMENTIN ) 875-125 MG tablet Take 1 tablet by mouth 2 (two) times daily for 7 days. 14 tablet Cindi Ghazarian E, PA-C      PDMP not  reviewed this encounter.   Marylene Rocky BRAVO, PA-C 06/01/24 1559

## 2024-06-01 NOTE — Discharge Instructions (Signed)
 Based on your symptoms and duration of illness, I believe you may have a bacterial sinus infection  These typically resolve with antibiotic therapy along with at-home comfort measures  Today I have sent in a prescription for  Augmentin 875-125 mg to be taken by mouth twice per day for 7 days FINISH THE ENTIRE COURSE unless you develop an allergic reaction or are instructed to discontinue.  It can take a few days for the antibiotic to kick in so I recommend symptomatic relief with over the counter medication such as the following: Dayquil/ Nyquil Theraflu Alkaseltzer   If you have high blood pressure I recommend that you take Mucinex, Robitussin, Tylenol instead of the combination medications.  Combination medications typically have a decongestant in them that can cause your blood pressure to be high.  These medications typically have Tylenol in them already so you do not need to supplement with more outside of those medications  Stay well hydrated with at least 75 oz of water per day to help with recovery  If at any point you start to develop swelling around the eyes and nose, trouble seeing, fevers that are not responding to medications, trouble breathing, passing out or headaches that are very severe please go to the emergency room for further evaluation management
